# Patient Record
Sex: Male | Born: 1970 | Race: Black or African American | Hispanic: No | Smoking: Current every day smoker
Health system: Southern US, Community
[De-identification: ages and names within clinical notes are randomized; demographics above are authoritative.]

## PROBLEM LIST (undated history)

## (undated) DIAGNOSIS — J449 Chronic obstructive pulmonary disease, unspecified: Secondary | ICD-10-CM

## (undated) DIAGNOSIS — G4730 Sleep apnea, unspecified: Secondary | ICD-10-CM

## (undated) DIAGNOSIS — R011 Cardiac murmur, unspecified: Secondary | ICD-10-CM

## (undated) DIAGNOSIS — J45909 Unspecified asthma, uncomplicated: Secondary | ICD-10-CM

## (undated) DIAGNOSIS — I10 Essential (primary) hypertension: Secondary | ICD-10-CM

## (undated) HISTORY — DX: Unspecified asthma, uncomplicated: J45.909

## (undated) HISTORY — DX: Sleep apnea, unspecified: G47.30

## (undated) HISTORY — PX: CHEST TUBE INSERTION: SHX231

---

## 1998-09-28 ENCOUNTER — Emergency Department (HOSPITAL_COMMUNITY): Admission: EM | Admit: 1998-09-28 | Discharge: 1998-09-28 | Payer: Self-pay | Admitting: Internal Medicine

## 1998-10-02 ENCOUNTER — Emergency Department (HOSPITAL_COMMUNITY): Admission: EM | Admit: 1998-10-02 | Discharge: 1998-10-02 | Payer: Self-pay | Admitting: Emergency Medicine

## 2004-10-25 ENCOUNTER — Ambulatory Visit: Payer: Self-pay | Admitting: Internal Medicine

## 2011-06-05 ENCOUNTER — Emergency Department (HOSPITAL_COMMUNITY): Payer: No Typology Code available for payment source

## 2011-06-05 ENCOUNTER — Emergency Department (HOSPITAL_COMMUNITY)
Admission: EM | Admit: 2011-06-05 | Discharge: 2011-06-05 | Disposition: A | Discharge status: Home / Self Care | Payer: No Typology Code available for payment source | Attending: Emergency Medicine | Admitting: Emergency Medicine

## 2011-06-05 DIAGNOSIS — M545 Low back pain, unspecified: Secondary | ICD-10-CM | POA: Insufficient documentation

## 2011-06-05 DIAGNOSIS — M546 Pain in thoracic spine: Secondary | ICD-10-CM | POA: Insufficient documentation

## 2011-06-05 DIAGNOSIS — S139XXA Sprain of joints and ligaments of unspecified parts of neck, initial encounter: Secondary | ICD-10-CM | POA: Insufficient documentation

## 2017-07-14 ENCOUNTER — Emergency Department (HOSPITAL_COMMUNITY): Payer: Self-pay

## 2017-07-14 ENCOUNTER — Other Ambulatory Visit: Payer: Self-pay

## 2017-07-14 ENCOUNTER — Encounter (HOSPITAL_COMMUNITY): Payer: Self-pay | Admitting: Emergency Medicine

## 2017-07-14 DIAGNOSIS — F17210 Nicotine dependence, cigarettes, uncomplicated: Secondary | ICD-10-CM | POA: Insufficient documentation

## 2017-07-14 DIAGNOSIS — F1290 Cannabis use, unspecified, uncomplicated: Secondary | ICD-10-CM | POA: Insufficient documentation

## 2017-07-14 DIAGNOSIS — J189 Pneumonia, unspecified organism: Secondary | ICD-10-CM | POA: Insufficient documentation

## 2017-07-14 LAB — CBC
HCT: 45.5 % (ref 39.0–52.0)
HEMOGLOBIN: 16 g/dL (ref 13.0–17.0)
MCH: 30.2 pg (ref 26.0–34.0)
MCHC: 35.2 g/dL (ref 30.0–36.0)
MCV: 85.8 fL (ref 78.0–100.0)
Platelets: 331 10*3/uL (ref 150–400)
RBC: 5.3 MIL/uL (ref 4.22–5.81)
RDW: 13.5 % (ref 11.5–15.5)
WBC: 18.4 10*3/uL — ABNORMAL HIGH (ref 4.0–10.5)

## 2017-07-14 LAB — BASIC METABOLIC PANEL
ANION GAP: 10 (ref 5–15)
BUN: 7 mg/dL (ref 6–20)
CALCIUM: 9.3 mg/dL (ref 8.9–10.3)
CO2: 24 mmol/L (ref 22–32)
Chloride: 99 mmol/L — ABNORMAL LOW (ref 101–111)
Creatinine, Ser: 1.06 mg/dL (ref 0.61–1.24)
GFR calc Af Amer: >60 mL/min (ref >60)
GFR calc non Af Amer: >60 mL/min (ref >60)
GLUCOSE: 125 mg/dL — AB (ref 65–99)
Potassium: 3.8 mmol/L (ref 3.5–5.1)
Sodium: 133 mmol/L — ABNORMAL LOW (ref 135–145)

## 2017-07-14 LAB — I-STAT TROPONIN, ED: Troponin i, poc: 0 ng/mL (ref 0.00–0.08)

## 2017-07-14 NOTE — ED Triage Notes (Signed)
Pt states he started having right sided cp that goes into his back. This started today around 2pm. Pt states he has had the same symptoms before approx a year ago. He feels it was related to back injury. Pt states pain is worse with movement. Denies nausea. Pt does endorse SOB at times.

## 2017-07-15 ENCOUNTER — Emergency Department (HOSPITAL_COMMUNITY)
Admission: EM | Admit: 2017-07-15 | Discharge: 2017-07-15 | Disposition: A | Discharge status: Home / Self Care | Payer: Self-pay | Attending: Emergency Medicine | Admitting: Emergency Medicine

## 2017-07-15 DIAGNOSIS — J181 Lobar pneumonia, unspecified organism: Secondary | ICD-10-CM

## 2017-07-15 DIAGNOSIS — J189 Pneumonia, unspecified organism: Secondary | ICD-10-CM

## 2017-07-15 HISTORY — DX: Cardiac murmur, unspecified: R01.1

## 2017-07-15 MED ORDER — HYDROCODONE-ACETAMINOPHEN 5-325 MG PO TABS
2.0000 | ORAL_TABLET | Freq: Once | ORAL | Status: AC
  Administered 2017-07-15: 2 via ORAL
  Filled 2017-07-15: qty 2

## 2017-07-15 MED ORDER — DOXYCYCLINE HYCLATE 100 MG PO TABS
100.0000 mg | ORAL_TABLET | Freq: Once | ORAL | Status: AC
  Administered 2017-07-15: 100 mg via ORAL
  Filled 2017-07-15: qty 1

## 2017-07-15 MED ORDER — KETOROLAC TROMETHAMINE 30 MG/ML IJ SOLN
30.0000 mg | Freq: Once | INTRAMUSCULAR | Status: AC
  Administered 2017-07-15: 30 mg via INTRAMUSCULAR
  Filled 2017-07-15: qty 1

## 2017-07-15 MED ORDER — DOXYCYCLINE HYCLATE 100 MG PO CAPS: 100.0000 mg | ORAL_CAPSULE | Freq: Two times a day (BID) | ORAL | 0 refills | Status: DC

## 2017-07-15 NOTE — ED Provider Notes (Signed)
[facility] EMER[facility] DEPARTMENT Provider Note   CSN: [id number] Arrival date & time: 07/14/17  1742     History   Chief Complaint Chief Complaint  Patient presents with  . Chest Pain    HPI Alex Maldonado is a 46 y.o. male.  The history is provided by the patient.  Chest Pain   This is a new problem. The current episode started 6 to 12 hours ago. The problem occurs constantly. The problem has not changed since onset.Pain location: right chest. The pain is moderate. The quality of the pain is described as pleuritic. The pain radiates to the upper back. The symptoms are aggravated by deep breathing and certain positions. Associated symptoms include cough and shortness of breath. Pertinent negatives include no abdominal pain, no fever, no hemoptysis, no leg pain, no lower extremity edema and no vomiting. He has tried nothing for the symptoms.  Pertinent negatives for past medical history include no CAD, no DVT and no PE.  Patient with h/o heart murmur presents with right sided chest pain, worse with deep breathing Reports he woke up from a nap and began having this pain He reports cough and mild SOB He reports he has had cough/congestion for past week, but CP just started No travel No h/o CAD/PE He is a smoker He is very active, he moves furniture but denies falls/trauma He usually never has limitations in activity   Reports h/o heart murmur, was followed as a child through teenage years, told it was normal Reports "lung surgery" several yrs ago on left chest to "get pus out"    Past Medical History:  Diagnosis Date  . Heart murmur     There are no active problems to display for this patient.   History reviewed. No pertinent surgical history.     Home Medications    Prior to Admission medications   Not on File    Family History History reviewed. No pertinent family history.  Social History Social History   Tobacco Use  . Smoking  status: Current Every Day Smoker    Types: Cigarettes  . Smokeless tobacco: Never Used  Substance Use Topics  . Alcohol use: Yes    Comment: beer  . Drug use: Yes    Types: Marijuana     Allergies   Patient has no known allergies.   Review of Systems Review of Systems  Constitutional: Positive for chills. Negative for fever.  Respiratory: Positive for cough and shortness of breath. Negative for hemoptysis.   Cardiovascular: Positive for chest pain. Negative for leg swelling.  Gastrointestinal: Negative for abdominal pain and vomiting.  Neurological: Negative for syncope.  All other systems reviewed and are negative.    Physical Exam Updated Vital Signs BP (!) 161/104 (BP Location: Right Arm)   Pulse 91   Temp 98.8 F (37.1 C) (Oral)   Resp 18   Ht 1.854 m (6\' 1" )   Wt 81.6 kg (180 lb)   SpO2 100%   BMI 23.75 kg/m   Physical Exam  CONSTITUTIONAL: Well developed/well nourished HEAD: Normocephalic/atraumatic EYES: EOMI/PERRL ENMT: Mucous membranes moist NECK: supple no meningeal signs SPINE/BACK:entire spine nontender CV: S1/S2 noted, murmur noted, does not radiate to neck LUNGS: brief/crackles to Right upper chest, no apparent distress ABDOMEN: soft, nontender  GU:no cva tenderness NEURO: Pt is awake/alert/appropriate, moves all extremitiesx4.  No facial droop.   EXTREMITIES: pulses normal/equal, full ROM, no LE edema/tenderness SKIN: warm, color normal PSYCH: no abnormalities of mood noted,  alert and oriented to situation  ED Treatments / Results  Labs (all labs ordered are listed, but only abnormal results are displayed) Labs Reviewed  BASIC METABOLIC PANEL - Abnormal; Notable for the following components:      Result Value   Sodium 133 (*)    Chloride 99 (*)    Glucose, Bld 125 (*)    All other components within normal limits  CBC - Abnormal; Notable for the following components:   WBC 18.4 (*)    All other components within normal limits  I-STAT  TROPONIN, ED    EKG  EKG Interpretation  Date/Time:  Monday July 14 2017 17:48:12 EST Ventricular Rate:  100 PR Interval:  150 QRS Duration: 96 QT Interval:  332 QTC Calculation: 428 R Axis:   97 Text Interpretation:  Normal sinus rhythm Rightward axis Incomplete right bundle branch block Nonspecific ST abnormality Abnormal ECG No previous ECGs available Confirmed by Ripley Fraise [phone]) on 07/15/2017 3:06:52 AM       Radiology Dg Chest 2 View  Result Date: 07/14/2017 CLINICAL DATA:  46 y/o  M; chest pain and pressure. EXAM: CHEST  2 VIEW COMPARISON:  None. FINDINGS: Normal heart size. Enlarged pulmonary artery silhouette. Ill-defined opacity of periphery of right upper lobe. Lungs are hyperinflated with flattened diaphragms compatible with COPD. Bones are unremarkable. IMPRESSION: Ill-defined opacity a periphery of right upper lobe may represent pneumonia or atelectasis. Findings of COPD. Enlarged main pulmonary suggests pulmonary artery hypertension. Electronically Signed   By: Kristine Garbe M.D.   On: 07/14/2017 19:21    Procedures Procedures   Medications Ordered in ED Medications  ketorolac (TORADOL) 30 MG/ML injection 30 mg (30 mg Intramuscular Given 07/15/17 0345)  HYDROcodone-acetaminophen (NORCO/VICODIN) 5-325 MG per tablet 2 tablet (2 tablets Oral Given 07/15/17 0345)  doxycycline (VIBRA-TABS) tablet 100 mg (100 mg Oral Given 07/15/17 0345)     Initial Impression / Assessment and Plan / ED Course  I have reviewed the triage vital signs and the nursing notes.  Pertinent labs & imaging results that were available during my care of the patient were reviewed by me and considered in my medical decision making (see chart for details).     4:20 AM Pt well appearing Reports recent cough/congestion over past week, then began to have chills, pleuritic CP With elevated WBC, abnormal CXR, suspicion for pneumonia By vitals, currently PERC negative He is  in no distress He is walking around room Denies dyspnea on exertion I doubt PE at this time   Advised to quit smoking as he may be developing COPD Discussed strict ER return precautions  Final Clinical Impressions(s) / ED Diagnoses   Final diagnoses:  Community acquired pneumonia of right upper lobe of lung [facility])    ED Discharge Orders        Ordered    doxycycline (VIBRAMYCIN) 100 MG capsule  2 times daily,   Status:  Discontinued     07/15/17 0416    doxycycline (VIBRAMYCIN) 100 MG capsule  2 times daily     07/15/17 0418       Ripley Fraise, MD 07/15/17 0423

## 2017-07-15 NOTE — ED Notes (Signed)
E-signature not working, pt verbalized understanding of DC instructions and prescriptions

## 2017-08-14 ENCOUNTER — Emergency Department (HOSPITAL_COMMUNITY): Payer: Self-pay

## 2017-08-14 ENCOUNTER — Encounter (HOSPITAL_COMMUNITY): Payer: Self-pay

## 2017-08-14 ENCOUNTER — Inpatient Hospital Stay (HOSPITAL_COMMUNITY)
Admission: EM | Admit: 2017-08-14 | Discharge: 2017-08-19 | DRG: 871 | Disposition: A | Discharge status: Home / Self Care | Payer: Self-pay | Attending: Internal Medicine | Admitting: Internal Medicine

## 2017-08-14 ENCOUNTER — Other Ambulatory Visit: Payer: Self-pay

## 2017-08-14 DIAGNOSIS — Z8701 Personal history of pneumonia (recurrent): Secondary | ICD-10-CM

## 2017-08-14 DIAGNOSIS — J9600 Acute respiratory failure, unspecified whether with hypoxia or hypercapnia: Secondary | ICD-10-CM

## 2017-08-14 DIAGNOSIS — L0291 Cutaneous abscess, unspecified: Secondary | ICD-10-CM

## 2017-08-14 DIAGNOSIS — E440 Moderate protein-calorie malnutrition: Secondary | ICD-10-CM | POA: Diagnosis present

## 2017-08-14 DIAGNOSIS — A419 Sepsis, unspecified organism: Principal | ICD-10-CM | POA: Diagnosis present

## 2017-08-14 DIAGNOSIS — J850 Gangrene and necrosis of lung: Secondary | ICD-10-CM | POA: Diagnosis present

## 2017-08-14 DIAGNOSIS — F17290 Nicotine dependence, other tobacco product, uncomplicated: Secondary | ICD-10-CM | POA: Diagnosis present

## 2017-08-14 DIAGNOSIS — J181 Lobar pneumonia, unspecified organism: Secondary | ICD-10-CM

## 2017-08-14 DIAGNOSIS — F17210 Nicotine dependence, cigarettes, uncomplicated: Secondary | ICD-10-CM | POA: Diagnosis present

## 2017-08-14 DIAGNOSIS — R011 Cardiac murmur, unspecified: Secondary | ICD-10-CM | POA: Diagnosis present

## 2017-08-14 DIAGNOSIS — R042 Hemoptysis: Secondary | ICD-10-CM | POA: Diagnosis present

## 2017-08-14 DIAGNOSIS — J189 Pneumonia, unspecified organism: Secondary | ICD-10-CM

## 2017-08-14 DIAGNOSIS — I10 Essential (primary) hypertension: Secondary | ICD-10-CM | POA: Diagnosis present

## 2017-08-14 DIAGNOSIS — Z720 Tobacco use: Secondary | ICD-10-CM

## 2017-08-14 DIAGNOSIS — Z6820 Body mass index (BMI) 20.0-20.9, adult: Secondary | ICD-10-CM

## 2017-08-14 HISTORY — DX: Essential (primary) hypertension: I10

## 2017-08-14 LAB — I-STAT TROPONIN, ED: TROPONIN I, POC: 0 ng/mL (ref 0.00–0.08)

## 2017-08-14 LAB — BASIC METABOLIC PANEL
ANION GAP: 8 (ref 5–15)
BUN: 7 mg/dL (ref 6–20)
CHLORIDE: 98 mmol/L — AB (ref 101–111)
CO2: 29 mmol/L (ref 22–32)
Calcium: 9 mg/dL (ref 8.9–10.3)
Creatinine, Ser: 1.1 mg/dL (ref 0.61–1.24)
GFR calc non Af Amer: >60 mL/min (ref >60)
Glucose, Bld: 92 mg/dL (ref 65–99)
Potassium: 4 mmol/L (ref 3.5–5.1)
Sodium: 135 mmol/L (ref 135–145)

## 2017-08-14 LAB — CBC
HCT: 42.1 % (ref 39.0–52.0)
HEMOGLOBIN: 14.3 g/dL (ref 13.0–17.0)
MCH: 28.7 pg (ref 26.0–34.0)
MCHC: 34 g/dL (ref 30.0–36.0)
MCV: 84.4 fL (ref 78.0–100.0)
Platelets: 601 10*3/uL — ABNORMAL HIGH (ref 150–400)
RBC: 4.99 MIL/uL (ref 4.22–5.81)
RDW: 13.1 % (ref 11.5–15.5)
WBC: 19.4 10*3/uL — ABNORMAL HIGH (ref 4.0–10.5)

## 2017-08-14 MED ORDER — DEXTROSE 5 % IV SOLN
500.0000 mg | Freq: Once | INTRAVENOUS | Status: AC
  Administered 2017-08-15: 500 mg via INTRAVENOUS
  Filled 2017-08-14: qty 500

## 2017-08-14 MED ORDER — DEXTROSE 5 % IV SOLN
1.0000 g | Freq: Once | INTRAVENOUS | Status: AC
  Administered 2017-08-15: 1 g via INTRAVENOUS
  Filled 2017-08-14: qty 10

## 2017-08-14 NOTE — ED Triage Notes (Signed)
Pt arrives POV c.o sharp CP that started last night on the right side under his arm, pt states he was seen here last month for the same and was dx with pneumonia. Pt also states he has been coughing up bright red blood since last night. Pt a.o, nad in triage

## 2017-08-14 NOTE — ED Provider Notes (Signed)
[facility] EMERGENCY DEPARTMENT Provider Note   CSN: [id number] Arrival date & time: 08/14/17  1813     History   Chief Complaint Chief Complaint  Patient presents with  . Chest Pain  . Cough    HPI Alex Maldonado is a 46 y.o. male.  The history is provided by the patient.  He has history of a heart murmur and hypertension.  1 month ago, he was treated for community-acquired pneumonia with doxycycline.  He took all of the antibiotics and stopped smoking at that point.  Over the last several days, cough has gotten worse and he has been coughing up blood.  He does admit to some weight loss and some night sweats, although he relates the night sweats to getting overheated from the fireplace.  He states he had lost his appetite originally but has gotten his appetite back but he does not know if he is regaining some of the weight that he lost.  Also, of note, he has not returned to work since he was seen last month.  He denies chest pain and dyspnea.  He is not able to tell me what his heart murmur is from, only that he was born with it.  Past Medical History:  Diagnosis Date  . Heart murmur   . Hypertension     There are no active problems to display for this patient.   History reviewed. No pertinent surgical history.     Home Medications    Prior to Admission medications   Medication Sig Start Date End Date Taking? Authorizing Provider  doxycycline (VIBRAMYCIN) 100 MG capsule Take 1 capsule (100 mg total) 2 (two) times daily by mouth. One po bid x 7 days 07/15/17   Ripley Fraise, MD    Family History No family history on file.  Social History Social History   Tobacco Use  . Smoking status: Current Every Day Smoker    Types: Cigarettes  . Smokeless tobacco: Never Used  Substance Use Topics  . Alcohol use: Yes    Comment: beer  . Drug use: Yes    Types: Marijuana     Allergies   Patient has no known allergies.   Review of  Systems Review of Systems  All other systems reviewed and are negative.    Physical Exam Updated Vital Signs BP 121/78 (BP Location: Right Arm)   Pulse 89   Temp 98.2 F (36.8 C) (Oral)   Resp 16   Ht 6' (1.829 m)   Wt 69.6 kg (153 lb 6.4 oz)   SpO2 96%   BMI 20.80 kg/m   Physical Exam  Nursing note and vitals reviewed.  46 year old male, resting comfortably and in no acute distress. Vital signs are normal. Oxygen saturation is 96%, which is normal. Head is normocephalic and atraumatic. PERRLA, EOMI. Oropharynx is clear. Neck is nontender and supple without adenopathy or JVD. Back is nontender and there is no CVA tenderness. Lungs are clear without rales, wheezes, or rhonchi.  There are some bronchial breath sounds heard at the apices. Chest is nontender. Heart has regular rate and rhythm.  There is a prominent midsystolic click with murmur consistent with mitral valve prolapse. Abdomen is soft, flat, nontender without masses or hepatosplenomegaly and peristalsis is normoactive. Extremities have no cyanosis or edema, full range of motion is present. Skin is warm and dry without rash. Neurologic: Mental status is normal, cranial nerves are intact, there are no motor or sensory deficits.  ED Treatments / Results  Labs (all labs ordered are listed, but only abnormal results are displayed) Labs Reviewed  BASIC METABOLIC PANEL - Abnormal; Notable for the following components:      Result Value   Chloride 98 (*)    All other components within normal limits  CBC - Abnormal; Notable for the following components:   WBC 19.4 (*)    Platelets 601 (*)    All other components within normal limits  CULTURE, BLOOD (ROUTINE X 2)  CULTURE, BLOOD (ROUTINE X 2)  ACID FAST SMEAR (AFB)  ACID FAST CULTURE WITH REFLEXED SENSITIVITIES  CULTURE, EXPECTORATED SPUTUM-ASSESSMENT  GRAM STAIN  DIFFERENTIAL  HIV ANTIBODY (ROUTINE TESTING)  STREP PNEUMONIAE URINARY ANTIGEN  PROCALCITONIN  CBC   COMPREHENSIVE METABOLIC PANEL  LEGIONELLA PNEUMOPHILA SEROGP 1 UR AG  I-STAT TROPONIN, ED  I-STAT CG4 LACTIC ACID, ED    EKG  EKG Interpretation  Date/Time:  Thursday August 14 2017 18:21:24 EST Ventricular Rate:  89 PR Interval:  156 QRS Duration: 106 QT Interval:  358 QTC Calculation: 435 R Axis:   91 Text Interpretation:  Normal sinus rhythm Right atrial enlargement Pulmonary disease pattern Incomplete right bundle branch block Right ventricular hypertrophy Abnormal ECG When compared with ECG of 07/14/2017, No significant change was found Confirmed by Delora Fuel ([id number]) on 08/14/2017 11:55:27 PM       Radiology Dg Chest 2 View  Result Date: 08/14/2017 CLINICAL DATA:  Recurrent productive cough. Patient diagnosed with pneumonia approximately 1 month ago. Current smoker. EXAM: CHEST  2 VIEW COMPARISON:  07/14/2017. FINDINGS: Cardiac silhouette normal in size, unchanged. Markedly enlarged main pulmonary trunk and central left pulmonary artery as noted previously. Hilar and mediastinal contours otherwise unremarkable. Marked worsening of airspace consolidation in the right upper lobe since the prior examination. Lungs remain clear otherwise. pleuroparenchymal scarring at the left base accounts for the blunted costophrenic angle, unchanged. Visualized bony thorax intact. Degenerative changes involving the visualized upper lumbar spine. IMPRESSION: Persistent or recurrent right upper lobe pneumonia which has worsened significantly since the chest x-ray 1 month ago. Electronically Signed   By: Evangeline Dakin M.D.   On: 08/14/2017 19:58    Procedures Procedures (including critical care time)  Medications Ordered in ED Medications  cefTRIAXone (ROCEPHIN) 1 g in dextrose 5 % 50 mL IVPB (not administered)  azithromycin (ZITHROMAX) 500 mg in dextrose 5 % 250 mL IVPB (not administered)  enoxaparin (LOVENOX) injection 40 mg (not administered)  0.9 %  sodium chloride infusion (not  administered)  acetaminophen (TYLENOL) tablet 650 mg (not administered)    Or  acetaminophen (TYLENOL) suppository 650 mg (not administered)  ondansetron (ZOFRAN) tablet 4 mg (not administered)    Or  ondansetron (ZOFRAN) injection 4 mg (not administered)  azithromycin (ZITHROMAX) 500 mg in dextrose 5 % 250 mL IVPB (not administered)  cefTRIAXone (ROCEPHIN) 1 g in dextrose 5 % 50 mL IVPB (not administered)     Initial Impression / Assessment and Plan / ED Course  I have reviewed the triage vital signs and the nursing notes.  Pertinent labs & imaging results that were available during my care of the patient were reviewed by me and considered in my medical decision making (see chart for details).  Worsening right upper lobe infiltrate with hemoptysis.  This, combined with history of weight loss and night sweats is very concerning for tuberculosis.  Blood cultures and lactic acid level are being obtained to rule out sepsis.  Sputum will be obtained to send  for testing for acid-fast bacilli.  Case is discussed with Dr. Tamala Julian, of [facility], who agrees to admit the patient.  Final Clinical Impressions(s) / ED Diagnoses   Final diagnoses:  Pneumonia of right upper lobe due to infectious organism [facility])    ED Discharge Orders    None       Delora Fuel, MD 78/24/23 [phone]

## 2017-08-14 NOTE — ED Notes (Signed)
ED Provider at bedside. 

## 2017-08-15 ENCOUNTER — Inpatient Hospital Stay (HOSPITAL_COMMUNITY): Payer: Self-pay

## 2017-08-15 ENCOUNTER — Telehealth: Payer: Self-pay | Admitting: Acute Care

## 2017-08-15 ENCOUNTER — Other Ambulatory Visit: Payer: Self-pay

## 2017-08-15 DIAGNOSIS — Z8701 Personal history of pneumonia (recurrent): Secondary | ICD-10-CM

## 2017-08-15 DIAGNOSIS — R091 Pleurisy: Secondary | ICD-10-CM

## 2017-08-15 DIAGNOSIS — R042 Hemoptysis: Secondary | ICD-10-CM

## 2017-08-15 DIAGNOSIS — E440 Moderate protein-calorie malnutrition: Secondary | ICD-10-CM

## 2017-08-15 DIAGNOSIS — J181 Lobar pneumonia, unspecified organism: Secondary | ICD-10-CM

## 2017-08-15 DIAGNOSIS — R011 Cardiac murmur, unspecified: Secondary | ICD-10-CM | POA: Diagnosis present

## 2017-08-15 DIAGNOSIS — R918 Other nonspecific abnormal finding of lung field: Secondary | ICD-10-CM

## 2017-08-15 DIAGNOSIS — J189 Pneumonia, unspecified organism: Secondary | ICD-10-CM

## 2017-08-15 DIAGNOSIS — A419 Sepsis, unspecified organism: Secondary | ICD-10-CM | POA: Diagnosis present

## 2017-08-15 DIAGNOSIS — J850 Gangrene and necrosis of lung: Secondary | ICD-10-CM | POA: Diagnosis present

## 2017-08-15 DIAGNOSIS — Z8709 Personal history of other diseases of the respiratory system: Secondary | ICD-10-CM

## 2017-08-15 HISTORY — DX: Pneumonia, unspecified organism: J18.9

## 2017-08-15 LAB — DIFFERENTIAL
BASOS PCT: 0 %
Basophils Absolute: 0 10*3/uL (ref 0.0–0.1)
EOS PCT: 0 %
Eosinophils Absolute: 0 10*3/uL (ref 0.0–0.7)
LYMPHS ABS: 2.9 10*3/uL (ref 0.7–4.0)
Lymphocytes Relative: 14 %
MONOS PCT: 9 %
Monocytes Absolute: 1.7 10*3/uL — ABNORMAL HIGH (ref 0.1–1.0)
Neutro Abs: 15.8 10*3/uL — ABNORMAL HIGH (ref 1.7–7.7)
Neutrophils Relative %: 77 %

## 2017-08-15 LAB — COMPREHENSIVE METABOLIC PANEL
ALT: 24 U/L (ref 17–63)
AST: 18 U/L (ref 15–41)
Albumin: 2.6 g/dL — ABNORMAL LOW (ref 3.5–5.0)
Alkaline Phosphatase: 76 U/L (ref 38–126)
Anion gap: 8 (ref 5–15)
BILIRUBIN TOTAL: 0.4 mg/dL (ref 0.3–1.2)
BUN: 8 mg/dL (ref 6–20)
CALCIUM: 8.8 mg/dL — AB (ref 8.9–10.3)
CHLORIDE: 100 mmol/L — AB (ref 101–111)
CO2: 26 mmol/L (ref 22–32)
CREATININE: 0.98 mg/dL (ref 0.61–1.24)
Glucose, Bld: 143 mg/dL — ABNORMAL HIGH (ref 65–99)
Potassium: 3.7 mmol/L (ref 3.5–5.1)
Sodium: 134 mmol/L — ABNORMAL LOW (ref 135–145)
TOTAL PROTEIN: 6.6 g/dL (ref 6.5–8.1)

## 2017-08-15 LAB — CBC
HEMATOCRIT: 37.5 % — AB (ref 39.0–52.0)
Hemoglobin: 13.2 g/dL (ref 13.0–17.0)
MCH: 29.1 pg (ref 26.0–34.0)
MCHC: 35.2 g/dL (ref 30.0–36.0)
MCV: 82.6 fL (ref 78.0–100.0)
PLATELETS: 514 10*3/uL — AB (ref 150–400)
RBC: 4.54 MIL/uL (ref 4.22–5.81)
RDW: 12.8 % (ref 11.5–15.5)
WBC: 20.3 10*3/uL — ABNORMAL HIGH (ref 4.0–10.5)

## 2017-08-15 LAB — I-STAT CG4 LACTIC ACID, ED
LACTIC ACID, VENOUS: 0.65 mmol/L (ref 0.5–1.9)
LACTIC ACID, VENOUS: 1.36 mmol/L (ref 0.5–1.9)

## 2017-08-15 LAB — EXPECTORATED SPUTUM ASSESSMENT W GRAM STAIN, RFLX TO RESP C

## 2017-08-15 LAB — STREP PNEUMONIAE URINARY ANTIGEN: Strep Pneumo Urinary Antigen: NEGATIVE

## 2017-08-15 LAB — HIV ANTIBODY (ROUTINE TESTING W REFLEX): HIV SCREEN 4TH GENERATION: NONREACTIVE

## 2017-08-15 LAB — EXPECTORATED SPUTUM ASSESSMENT W REFEX TO RESP CULTURE

## 2017-08-15 LAB — PROCALCITONIN: Procalcitonin: <0.1 ng/mL

## 2017-08-15 MED ORDER — MORPHINE SULFATE (PF) 4 MG/ML IV SOLN
2.0000 mg | INTRAVENOUS | Status: AC
  Administered 2017-08-15: 2 mg via INTRAVENOUS
  Filled 2017-08-15: qty 1

## 2017-08-15 MED ORDER — ONDANSETRON HCL 4 MG PO TABS: 4.0000 mg | ORAL_TABLET | Freq: Four times a day (QID) | ORAL | Status: DC | PRN

## 2017-08-15 MED ORDER — VANCOMYCIN HCL IN DEXTROSE 1-5 GM/200ML-% IV SOLN
1000.0000 mg | Freq: Once | INTRAVENOUS | Status: AC
  Administered 2017-08-15: 1000 mg via INTRAVENOUS
  Filled 2017-08-15: qty 200

## 2017-08-15 MED ORDER — SODIUM CHLORIDE 0.9 % IV SOLN
Freq: Once | INTRAVENOUS | Status: AC
  Administered 2017-08-15: 02:00:00 via INTRAVENOUS

## 2017-08-15 MED ORDER — DEXTROSE 5 % IV SOLN: 500.0000 mg | Freq: Every day | INTRAVENOUS | Status: DC

## 2017-08-15 MED ORDER — PIPERACILLIN-TAZOBACTAM 3.375 G IVPB
3.3750 g | Freq: Three times a day (TID) | INTRAVENOUS | Status: DC
  Administered 2017-08-15 – 2017-08-18 (×10): 3.375 g via INTRAVENOUS
  Filled 2017-08-15 (×14): qty 50

## 2017-08-15 MED ORDER — ACETAMINOPHEN 650 MG RE SUPP: 650.0000 mg | Freq: Four times a day (QID) | RECTAL | Status: DC | PRN

## 2017-08-15 MED ORDER — ENSURE ENLIVE PO LIQD
237.0000 mL | Freq: Two times a day (BID) | ORAL | Status: DC
  Administered 2017-08-15 – 2017-08-19 (×5): 237 mL via ORAL

## 2017-08-15 MED ORDER — ONDANSETRON HCL 4 MG/2ML IJ SOLN: 4.0000 mg | Freq: Four times a day (QID) | INTRAMUSCULAR | Status: DC | PRN

## 2017-08-15 MED ORDER — DEXTROSE 5 % IV SOLN: 1.0000 g | Freq: Every day | INTRAVENOUS | Status: DC

## 2017-08-15 MED ORDER — OXYCODONE-ACETAMINOPHEN 5-325 MG PO TABS
1.0000 | ORAL_TABLET | Freq: Four times a day (QID) | ORAL | Status: DC | PRN
  Administered 2017-08-15 – 2017-08-16 (×6): 1 via ORAL
  Filled 2017-08-15 (×6): qty 1

## 2017-08-15 MED ORDER — PIPERACILLIN-TAZOBACTAM 3.375 G IVPB 30 MIN
3.3750 g | Freq: Once | INTRAVENOUS | Status: AC
  Administered 2017-08-15: 3.375 g via INTRAVENOUS
  Filled 2017-08-15: qty 50

## 2017-08-15 MED ORDER — OXYCODONE-ACETAMINOPHEN 5-325 MG PO TABS
1.0000 | ORAL_TABLET | ORAL | Status: AC
  Administered 2017-08-15: 1 via ORAL
  Filled 2017-08-15: qty 1

## 2017-08-15 MED ORDER — ACETAMINOPHEN 325 MG PO TABS
650.0000 mg | ORAL_TABLET | Freq: Four times a day (QID) | ORAL | Status: DC | PRN
  Administered 2017-08-15 (×2): 650 mg via ORAL
  Filled 2017-08-15 (×2): qty 2

## 2017-08-15 MED ORDER — ENOXAPARIN SODIUM 40 MG/0.4ML ~~LOC~~ SOLN
40.0000 mg | Freq: Every day | SUBCUTANEOUS | Status: DC
  Filled 2017-08-15 (×6): qty 0.4

## 2017-08-15 MED ORDER — VANCOMYCIN HCL IN DEXTROSE 1-5 GM/200ML-% IV SOLN
1000.0000 mg | Freq: Three times a day (TID) | INTRAVENOUS | Status: DC
  Administered 2017-08-15 – 2017-08-17 (×6): 1000 mg via INTRAVENOUS
  Filled 2017-08-15 (×7): qty 200

## 2017-08-15 NOTE — ED Notes (Signed)
Heart Healthy Diet was ordered for Lunch. 

## 2017-08-15 NOTE — Telephone Encounter (Signed)
lmtcb X1 for pt to see if we can move his appt to 1:30 at [facility] request.

## 2017-08-15 NOTE — ED Notes (Signed)
Report given [place]

## 2017-08-15 NOTE — Progress Notes (Signed)
  PROGRESS NOTE  Patient admitted earlier this morning. See H&P. Alex Maldonado is a 46 yo black male with past medical history of HTN, tobacco abuse, heart murmur, reported history of left sided empyema requiring drainage in 2000. He now presents with complaint of right sided chest pain and hemoptysis. He states that he had a cold in November, which then developed into a right-sided pneumonia. He was seen in the ED, given doxycycline and sent home. He reports that he did have improvement in night sweats, chest pain, and cough. Then night prior to admission, his symptoms got worse, started to have hemoptysis, severe sharp right-sided chest pain. He also admits to weight loss of approximately 15-20 pounds in the last month.  He states that he was born in [place] and has been raised here all of his life.  Denies any travel outside of the [place].  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  He has 2 children, one is 10 and in college and one is 49 and goes to school here.  His younger son had a cough for 1 day a few weeks ago but has been healthy otherwise.  He has no family history of cancer that he knows of.  He has 1 dog at home, no other pets.  He was in prison for about 3 years and was released in 2016.  He has never been homeless, never served in the [employer]. He is a current tobacco smoker, has smoked 3-4 cigarettes daily since age [age] and quit on November 19 when he was diagnosed with pneumonia. He used to smoke marijuana; denies other illicit drug use.   Workup in the emergency department revealed leukocytosis of 19.4, CT chest revealed masslike consolidation in the right upper lobe. Findings suspicious for necrotic pneumonia, neoplasm could present with a similar appearance but is felt less likely.  Sputum culture was obtained. He was started on Rocephin, Zithromax, vancomycin. Will switch to vanco/zosyn until culture results available. Consult pulmonology.    [name],  DO [facility] [email or web address] Password [facility] 08/15/2017, 9:17 AM

## 2017-08-15 NOTE — H&P (Signed)
History and Physical    Alex Maldonado FWY:[id number] DOB: 08-09-1971 DOA: 08/14/2017  Referring MD/NP/PA:Dr. Delora Fuel PCP: Patient, No Pcp Per  Patient coming from: Home  Chief Complaint: Chest pain  I have personally briefly reviewed patient's old medical records in [place]   HPI: Alex Maldonado is a 46 y.o. 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 Patient was just seen in the emergency department on 11/20 with complaints right-sided chest pain that was pleuritic in nature found to have signs of pneumonia at that time.  Sent home with doxycycline.  He reports taking all of his medications as prescribed and felt like he had gotten better over the last week.  However, patient notes that he had continued to have a productive cough.  Last night he began coughing up small amounts of blood.  Associated symptoms included weight loss of approximately 15-20 pounds in the last month, night sweats, decreased appetite.  He reports that most of these symptoms had somewhat improved, and he was starting to regain weight following completing the initial antibiotics. 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  He denies having any fever, chills, dysuria, recent travel, leg swelling, or calf pain.  Patient reports that he quit smoking tobacco, drinking alcohol, and using any illicit drugs after he was seen it was seen last month.  ED Course: Upon admission into the emergency room patient was seen to be afebrile, pulse 89-105, respirations 14-23, blood pressures 121/78-100 32/89, O2 saturations 90-100% on room air.  Labs revealed WBC 19.9,  platelet count 601.  Chest x-ray showed worsening right upper lobe pneumonia.  Blood cultures along with AFB ordered.  Lactic acid was currently pending and patient was started on antibiotics of ceftriaxone and azithromycin.    Review of Systems  Constitutional: Positive for malaise/fatigue and weight loss. Negative for chills and fever.  HENT: Negative for ear discharge and nosebleeds.   Eyes: Negative for photophobia and pain.  Respiratory: Positive for cough, hemoptysis and sputum production.   Cardiovascular: Positive for chest pain. Negative for leg swelling.  Gastrointestinal: Negative for abdominal pain, nausea and vomiting.  Genitourinary: Negative for dysuria and frequency.  Musculoskeletal: Positive for back pain. Negative for falls.  Skin: Negative for itching and rash.  Neurological: Negative for speech change and focal weakness.  Endo/Heme/Allergies: Negative for environmental allergies and polydipsia.  Psychiatric/Behavioral: Negative for memory loss. The patient is not nervous/anxious.     Past Medical History:  Diagnosis Date  . Heart murmur   . Hypertension     History reviewed. No pertinent surgical history.   reports that he has been smoking cigarettes.  he has never used smokeless tobacco. He reports that he drinks alcohol. He reports that he uses drugs. Drug: Marijuana.  No Known Allergies  No family history on file.  Prior to Admission medications   Medication Sig Start Date End Date Taking? Authorizing Provider  doxycycline (VIBRAMYCIN) 100 MG capsule Take 1 capsule (100 mg total) 2 (two) times daily by mouth. One po bid x 7 days 07/15/17   Ripley Fraise, MD    Physical Exam:  Constitutional:  Male who appears to be in no acute distress at this time Vitals:   08/14/17 2330 08/14/17 2345 08/14/17 2358 08/15/17 0005  BP:   127/85 123/88  Pulse: (!) 105 100 96 92  Resp: 19 19 14  (!) 23  Temp:      TempSrc:      SpO2: 90% 100% 96% 97%  Weight:        Height:       Eyes: PERRL, lids and conjunctivae normal ENMT: Mucous membranes are moist. Posterior pharynx clear of any exudate or lesions.Normal dentition.  Neck: normal, supple, no masses, no thyromegaly Respiratory: Regular respiratory effort with rales appreciated of the right upper lung field. Cardiovascular: Regular rate and rhythm, + systolic ejection murmur that appears to be a 2/6. no rubs / gallops. No extremity edema. 2+ pedal pulses. No carotid bruits.  Abdomen: no tenderness, no masses palpated. No hepatosplenomegaly. Bowel sounds positive.  Musculoskeletal: no clubbing / cyanosis. No joint deformity upper and lower extremities. Good ROM, no contractures. Normal muscle tone.  Skin: no rashes, lesions, ulcers. No induration Neurologic: CN 2-12 grossly intact. Sensation intact, DTR normal. Strength 5/5 in all 4.  Psychiatric: Normal judgment and insight. Alert and oriented x 3. Normal mood.     Labs on Admission: I have personally reviewed following labs and imaging studies  CBC: Recent Labs  Lab 08/14/17 1850  WBC 19.4*  HGB 14.3  HCT 42.1  MCV 84.4  PLT 720*   Basic Metabolic Panel: Recent Labs  Lab 08/14/17 1850  NA 135  K 4.0  CL 98*  CO2 29  GLUCOSE 92  BUN 7  CREATININE 1.10  CALCIUM 9.0   GFR: Estimated Creatinine Clearance: 83.5 mL/min (by C-G formula based on SCr of 1.1 mg/dL). Liver Function Tests: No results for input(s): AST, ALT, ALKPHOS, BILITOT, PROT, ALBUMIN in the last 168 hours. No results for input(s): LIPASE, AMYLASE in the last 168 hours. No results for input(s): AMMONIA in the last 168 hours. Coagulation Profile: No results for input(s): INR, PROTIME in the last 168 hours. Cardiac Enzymes: No results for input(s): CKTOTAL, CKMB, CKMBINDEX, TROPONINI in the last 168 hours. BNP (last 3 results) No results for input(s): PROBNP in the last 8760 hours. HbA1C: No results for input(s): HGBA1C in the last 72 hours. CBG: No results for  input(s): GLUCAP in the last 168 hours. Lipid Profile: No results for input(s): CHOL, HDL, LDLCALC, TRIG, CHOLHDL, LDLDIRECT in the last 72 hours. Thyroid Function Tests: No results for input(s): TSH, T4TOTAL, FREET4, T3FREE, THYROIDAB in the last 72 hours. Anemia Panel: No results for input(s): VITAMINB12, FOLATE, FERRITIN, TIBC, IRON, RETICCTPCT in the last 72 hours. Urine analysis: No results found for: COLORURINE, APPEARANCEUR, LABSPEC, PHURINE, GLUCOSEU, HGBUR, BILIRUBINUR, KETONESUR, PROTEINUR, UROBILINOGEN, NITRITE, LEUKOCYTESUR Sepsis Labs: No results found for this or any previous visit (from the past 240 hour(s)).   Radiological Exams on Admission: Dg Chest 2 View  Result Date: 08/14/2017 CLINICAL DATA:  Recurrent productive cough. Patient diagnosed with pneumonia approximately 1 month ago. Current smoker. EXAM: CHEST  2 VIEW COMPARISON:  07/14/2017. FINDINGS: Cardiac silhouette normal in size, unchanged. Markedly enlarged main pulmonary trunk and central left pulmonary artery as noted previously. Hilar and mediastinal contours otherwise unremarkable. Marked worsening of airspace consolidation in the right upper lobe since the prior examination. Lungs remain clear otherwise. pleuroparenchymal scarring at the left base accounts for the blunted costophrenic angle, unchanged. Visualized bony thorax intact. Degenerative changes involving the visualized upper lumbar spine.  IMPRESSION: Persistent or recurrent right upper lobe pneumonia which has worsened significantly since the chest x-ray 1 month ago. Electronically Signed   By: Evangeline Dakin M.D.   On: 08/14/2017 19:58    EKG: Independently reviewed.  Sinus rhythm with signs of right ventricular hypertrophy.  Similar to previous tracing  Assessment/Plan Sepsis  2/2 necrotizing pneumonia: Acute.  Patient presents with complaints of right sided chest pain.  Previously found to have right upper lobe pneumonia treated with doxycycline last  month.  On admission patient found to be tachycardic and tachypneic with upper lobe pneumonia seen on x-ray.  Lactic acid was seen to be reassuring at 1.36.  Patient was initially started on empiric antibiotics of ceftriaxone and azithromycin. - Admit to telemetry bed - Droplet precautions - Follow-up fluid, sputum, and AFB studies - Continue Rocephin and azithromycin - Added on vancomycin for more gram-positive coverage after CT scan - oxycodone prn pain - May warrant ID and pulmonary consultation in a.m. - Recheck CBC in a.m.  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  Question at heart murmur related to previous history of empyema. - Check echocardiogram  History of empyema: Patient reports in the early 2000 having a left sided empyema requiring what sounds like cardiothoracic surgery to evacuate.  DVT prophylaxis: lovenox Code Status: full  Family Communication: Discussed plan of care with the patient and family present at bedside Disposition Plan: TBD Consults called: none  Admission status: Inpatient  Norval Morton MD [facility] Pager [phone]   If 7PM-7AM, please contact night-coverage [email or web address] Password [email or web address]  08/15/2017, 12:09 AM

## 2017-08-15 NOTE — ED Notes (Signed)
Admitting MD at bedside.

## 2017-08-15 NOTE — Progress Notes (Signed)
Pharmacy Antibiotic Note  Alex Maldonado is a 46 y.o. male admitted on 08/14/2017 with necrotizing PNA.  Pharmacy has been consulted for Vancomycin dosing.  Vancomycin 1 g IV given in ED at [place]: Vancomycin 1 g IV q8h   Height: 6' (182.9 cm) Weight: 153 lb 6.4 oz (69.6 kg) IBW/kg (Calculated) : 77.6  Temp (24hrs), Avg:98.3 F (36.8 C), Min:98.2 F (36.8 C), Max:98.3 F (36.8 C)  Recent Labs  Lab 08/14/17 1850 08/15/17 0007 08/15/17 0233 08/15/17 0332  WBC 19.4*  --   --  20.3*  CREATININE 1.10  --   --  0.98  LATICACIDVEN  --  1.36 0.65  --     Estimated Creatinine Clearance: 93.7 mL/min (by C-G formula based on SCr of 0.98 mg/dL).    No Known Allergies   Caryl Pina 08/15/2017 5:11 AM

## 2017-08-15 NOTE — ED Notes (Signed)
Heart Healthy diet breakfast tray ordered @ 0727.  

## 2017-08-15 NOTE — ED Notes (Signed)
Patient transported to CT 

## 2017-08-15 NOTE — ED Notes (Signed)
Pt is alert and oriented.  Pt is here with PNA and receiving anbx.  Pt denies discomfort.  Congested.  Transported to xray.

## 2017-08-15 NOTE — Telephone Encounter (Signed)
SG please advise if ok to keep double-book.  This is per [place].  Next available appt with any provider in office is 09/08/17.

## 2017-08-15 NOTE — Consult Note (Signed)
Name: Alex Maldonado MRN: [id number] DOB: 10/18/1970    ADMISSION DATE:  08/14/2017 CONSULTATION DATE:  12/20  REFERRING MD :  CHOI  CHIEF COMPLAINT:  Necrotizing PNA   BRIEF PATIENT DESCRIPTION:  46 yom admitted 12/21 w/ RUL cavitary PNA  SIGNIFICANT EVENTS    STUDIES:  CT chest 12/21: 1. Masslike consolidation in the right upper lobe extending from the hilum to the pleural surface peripherally. Internal cystic foci suggesting necrosis. Findings suspicious for necrotic pneumonia, neoplasm could present with a similar appearance but is felt less Likely. 2. Probable prominent right hilar node, suboptimally assessed without IV contrast. 3. Significantly dilated main and left pulmonary arteries, raising concern for pulmonary arterial hypertension. Lack of IV contrast limits more detailed assessment. 4. Moderate endobronchial debris in the left mainstem bronchus tracking into the lower lobe of left lower lobe atelectasis. This may be mucus plugging, however aspiration is considered. 5. Marked dilatation of the main and left pulmonary artery 6. 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  HIV AB 12/21:>>> U strep 12/21: negative  U legionella 12/21>>> afb 12/21>>>  HISTORY OF PRESENT ILLNESS:    46 year old aam w/ + h/o smoking, prior PNA w/ empyema on left requiring VATS in 2000, heart murmur. 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 Was in [place] ~ 3 yrs prior. Was seen in ER back on 11/20 w/ cc: right sided CP w/ cough after about 2 weeks of cold symptoms. Was treated w/ 7d course of doxy. Never really returned to baseline. Still w/ cough, poor PO intake, wt loss. On 12/19 started to have worsening cough w/ progressive blood tinged sputum that progressed to gross bloody sputum and associated right sided pleuritic pain which is why he presented to the ER  on 21st. In ER he was found to have RUL airspace disease and CT chest showed marked RUL consolidation w/ areas concerning for cavitation. PCCM asked to see re: these CT findings.  PAST MEDICAL HISTORY :   has a past medical history of Heart murmur and Hypertension.  has no past surgical history on file. Prior to Admission medications   Medication Sig Start Date End Date Taking? Authorizing Provider  Multiple Vitamin (MULTIVITAMIN WITH MINERALS) TABS tablet Take 1 tablet by mouth daily.   Yes [provider]   No Known Allergies  FAMILY HISTORY:  family history is not on file. SOCIAL HISTORY:  reports that he has been smoking cigarettes.  he has never used smokeless tobacco. He reports that he drinks alcohol. He reports that he uses drugs. Drug: Marijuana.  REVIEW OF SYSTEMS:   Constitutional: Negative for fever, chills, weight loss, fatigue and diaphoresis.  HENT: Negative for hearing loss, ear pain, nosebleeds, congestion, sore throat, neck pain, tinnitus and ear discharge.  tooth pain chronic ~ 95yrs left lower jaw  Eyes: Negative for blurred vision, double vision, photophobia, pain, discharge and redness.  Respiratory: + cough, hemoptysis, sputum production, shortness of breath, wheezing and stridor.   Cardiovascular: right sided chest pain, palpitations, orthopnea, claudication, leg swelling and PND.  Gastrointestinal: Negative for heartburn, nausea, vomiting, abdominal pain, diarrhea, constipation, blood in stool and melena.  Genitourinary: Negative for dysuria, urgency, frequency, hematuria and flank pain.  Musculoskeletal: Negative for myalgias, back pain, joint pain and falls.  Skin: Negative for itching and rash.  Neurological: Negative for dizziness, tingling, tremors, sensory change, speech change, focal weakness,  seizures, loss of consciousness, weakness and headaches.  Endo/Heme/Allergies: Negative for environmental allergies and polydipsia. Does not bruise/bleed  easily.  SUBJECTIVE:  + cough w/ blood and chest pain on right  VITAL SIGNS: Temp:  [98.2 F (36.8 C)-98.3 F (36.8 C)] 98.2 F (36.8 C) (12/20 2148) Pulse Rate:  [73-105] 82 (12/21 1000) Resp:  [14-30] 25 (12/21 1000) BP: (103-143)/(68-89) 112/70 (12/21 1000) SpO2:  [90 %-100 %] 99 % (12/21 1000) Weight:  [153 lb (69.4 kg)-153 lb 6.4 oz (69.6 kg)] 153 lb (69.4 kg) (12/21 0817)  PHYSICAL EXAMINATION: General:  Well developed 46 year old aam. Currently no acute distress.  Neuro:  Awake and alert no focal def  HEENT:  [place] MMM no JVD  Cardiovascular:  RRR  Lungs:  Scattered rhonchi, decreased RUL Abdomen:  Soft not tender  Musculoskeletal:  Equal st and bulk Skin:  Warm and dry   Recent Labs  Lab 08/14/17 1850 08/15/17 0332  NA 135 134*  K 4.0 3.7  CL 98* 100*  CO2 29 26  BUN 7 8  CREATININE 1.10 0.98  GLUCOSE 92 143*   Recent Labs  Lab 08/14/17 1850 08/15/17 0332  HGB 14.3 13.2  HCT 42.1 37.5*  WBC 19.4* 20.3*  PLT 601* 514*   Dg Chest 2 View  Result Date: 08/14/2017 CLINICAL DATA:  Recurrent productive cough. Patient diagnosed with pneumonia approximately 1 month ago. Current smoker. EXAM: CHEST  2 VIEW COMPARISON:  07/14/2017. FINDINGS: Cardiac silhouette normal in size, unchanged. Markedly enlarged main pulmonary trunk and central left pulmonary artery as noted previously. Hilar and mediastinal contours otherwise unremarkable. Marked worsening of airspace consolidation in the right upper lobe since the prior examination. Lungs remain clear otherwise. pleuroparenchymal scarring at the left base accounts for the blunted costophrenic angle, unchanged. Visualized bony thorax intact. Degenerative changes involving the visualized upper lumbar spine. IMPRESSION: Persistent or recurrent right upper lobe pneumonia which has worsened significantly since the chest x-ray 1 month ago. Electronically Signed   By: Evangeline Dakin M.D.   On: 08/14/2017 19:58   Ct Chest Wo  Contrast  Result Date: 08/15/2017 CLINICAL DATA:  Pneumonia, unresolved or complicated. Right-sided pain. Hemoptysis. EXAM: CT CHEST WITHOUT CONTRAST TECHNIQUE: Multidetector CT imaging of the chest was performed following the standard protocol without IV contrast. COMPARISON:  Radiograph yesterday as well as 07/14/2017 FINDINGS: Cardiovascular: The heart is normal in size. Marked enlargement of the main pulmonary artery measuring 4.5 cm, dilated left pulmonary artery measuring 3.1 cm. Possible distal perforating of the pulmonary artery is. Right pulmonary artery is normal in caliber. Detailed vascular assessment limited given lack contrast. Thoracic aorta is normal in caliber. There is no pericardial effusion. Mediastinum/Nodes: Possible enlarged right hilar node measuring 13 mm, suboptimally assessed without IV contrast. Small mediastinal nodes not enlarged by size criteria. The esophagus is decompressed. Visualized thyroid gland is normal. Lungs/Pleura: Dense right upper lobe consolidation extending from the hilum to the pleural surface peripherally. Consolidation appears masslike in the periphery. There are internal air bronchograms and cystic spaces cyst spaces suggesting necrosis. Surrounding irregular and ground-glass opacities, with patchy ground-glass opacity in the anterior most right upper lobe. Right pleural thickening at the apex. No bony destructive change of the adjacent ribs. There is an accessory cardiac bronchus arising from bronchus intermedius with a short bronchial stump, only minimal associated lung parenchyma. Intraluminal debris within the left mainstem bronchus tracking into the left lower bronchus with irregular left low linear opacities likely postobstructive. Minimal mucous in the  right mainstem bronchus. No pulmonary edema. Upper Abdomen: No acute abnormality. No left adrenal nodule. Right adrenal gland not included in the field of view. Musculoskeletal: There are no acute or  suspicious osseous abnormalities. No bony destructive change of right upper ribs in the region of consolidation. IMPRESSION: 1. Masslike consolidation in the right upper lobe extending from the hilum to the pleural surface peripherally. Internal cystic foci suggesting necrosis. Findings suspicious for necrotic pneumonia, neoplasm could present with a similar appearance but is felt less likely. 2. Probable prominent right hilar node, suboptimally assessed without IV contrast. 3. Significantly dilated main and left pulmonary arteries, raising concern for pulmonary arterial hypertension. Lack of IV contrast limits more detailed assessment. 4. Moderate endobronchial debris in the left mainstem bronchus tracking into the lower lobe of left lower lobe atelectasis. This may be mucus plugging, however aspiration is considered. 5. Marked dilatation of the main and left pulmonary artery 6. 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 Electronically Signed   By: Jeb Levering M.D.   On: 08/15/2017 01:27    ASSESSMENT / PLAN:  46 year old male with history of PNA and empyema presenting with RUL PNA, ?mass, hemoptysis and chest pain.  On exam, diminished BS on the RUL.  I reviewed chest CT myself, evidence of necrotizing PNA noted.  Discussed with PCCM-NP.  PNA: recent abx exposure  - Vanc/zosyn  - F/U on culture  - Orthopanogram   ?Mass:  - F/u chest CT in 6 wks  - No bronch  Hemoptysis: due to cough  - F/U clinically  - No bronch unless recurs   Chest pain: due to pleurisy   - NSAID as needed  - Follow clinically  PCCM will follow.  Will see again on Monday.  Patient seen and examined, agree with above note.  I dictated the care and orders written for this patient under my direction.  Rush Farmer, MD [phone]   08/15/2017, 10:04 AM

## 2017-08-15 NOTE — Progress Notes (Signed)
Initial Nutrition Assessment  DOCUMENTATION CODES:   Non-severe (moderate) malnutrition in context of acute illness/injury  INTERVENTION:   Ensure Enlive po BID, each supplement provides 350 kcal and 20 grams of protein  Discussed importance of nutrition at home, Discussed alternatives to ensure if needed.    NUTRITION DIAGNOSIS:   Moderate Malnutrition related to acute illness as evidenced by mild fat depletion, mild muscle depletion(10% weight loss x 4 weeks).  GOAL:   Patient will meet greater than or equal to 90% of their needs  MONITOR:   PO intake, Supplement acceptance  REASON FOR ASSESSMENT:   Malnutrition Screening Tool    ASSESSMENT:   15 yom admitted 12/21 w/ RUL cavitary PNA   Per MD evidence of necrotizing PNA  Per pt he was in normal health until a month ago when he got sick. He lost his appetite and had diarrhea causing him to lose a lot of weight. He reports usual weight of 170 lb. 10% weight loss x 1 month.  He works for a [employer] and is very active. He states that he was smoking and drinking ETOH most days. His ETOH was 6-9 beers at night after work. On days he did not work he would start drinking the morning. He would not eat right those days. A month ago he was only eating one meal a day, usually dinner (balogna sandwich or fast food meal). He was also having a lot of diarrhea due to antibiotics. For the last 2 weeks his appetite was back to normal.  He likes ensure and drank them for about a week at home but stopped because of the expense and he has not been working due to illness.   Medications and labs reviewed     Diet Order:  Diet Heart Room service appropriate? Yes; Fluid consistency: Thin  EDUCATION NEEDS:   Education needs have been addressed  Skin:  Skin Assessment: Reviewed RN Assessment  Last BM:  unknown  Height:   Ht Readings from Last 1 Encounters:  08/15/17 6' (1.829 m)    Weight:   Wt Readings from Last 1  Encounters:  08/15/17 153 lb (69.4 kg)    Ideal Body Weight:  80.9 kg  BMI:  Body mass index is 20.75 kg/m.  Estimated Nutritional Needs:   Kcal:  2100-2300  Protein:  105-120 grams  Fluid:  >2.1 L/day    Maylon Peppers RD, LDN, CNSC [phone] Pager [phone] After Hours Pager

## 2017-08-15 NOTE — Telephone Encounter (Signed)
I would prefer not to be double booked at 3 with 2 hospital follow ups. I would prefer to start clinic early and see the patient at 1:30. While my mother is recovering I would prefer not to make my day any longer . Thanks

## 2017-08-16 LAB — CBC
HCT: 38 % — ABNORMAL LOW (ref 39.0–52.0)
HEMOGLOBIN: 13 g/dL (ref 13.0–17.0)
MCH: 28.4 pg (ref 26.0–34.0)
MCHC: 34.2 g/dL (ref 30.0–36.0)
MCV: 83 fL (ref 78.0–100.0)
Platelets: 533 10*3/uL — ABNORMAL HIGH (ref 150–400)
RBC: 4.58 MIL/uL (ref 4.22–5.81)
RDW: 12.8 % (ref 11.5–15.5)
WBC: 18.2 10*3/uL — AB (ref 4.0–10.5)

## 2017-08-16 LAB — BASIC METABOLIC PANEL
ANION GAP: 10 (ref 5–15)
BUN: 9 mg/dL (ref 6–20)
CHLORIDE: 99 mmol/L — AB (ref 101–111)
CO2: 28 mmol/L (ref 22–32)
Calcium: 8.7 mg/dL — ABNORMAL LOW (ref 8.9–10.3)
Creatinine, Ser: 1.09 mg/dL (ref 0.61–1.24)
Glucose, Bld: 84 mg/dL (ref 65–99)
POTASSIUM: 3.8 mmol/L (ref 3.5–5.1)
SODIUM: 137 mmol/L (ref 135–145)

## 2017-08-16 LAB — ACID FAST SMEAR (AFB, MYCOBACTERIA): Acid Fast Smear: NEGATIVE

## 2017-08-16 LAB — LEGIONELLA PNEUMOPHILA SEROGP 1 UR AG: L. PNEUMOPHILA SEROGP 1 UR AG: NEGATIVE

## 2017-08-16 LAB — ACID FAST SMEAR (AFB)

## 2017-08-16 MED ORDER — OXYCODONE-ACETAMINOPHEN 5-325 MG PO TABS
1.0000 | ORAL_TABLET | Freq: Once | ORAL | Status: AC
  Administered 2017-08-17: 1 via ORAL
  Filled 2017-08-16: qty 1

## 2017-08-16 MED ORDER — OXYCODONE-ACETAMINOPHEN 5-325 MG PO TABS
1.0000 | ORAL_TABLET | Freq: Four times a day (QID) | ORAL | Status: DC | PRN
  Administered 2017-08-17 – 2017-08-19 (×10): 2 via ORAL
  Filled 2017-08-16 (×10): qty 2

## 2017-08-16 NOTE — Progress Notes (Signed)
PROGRESS NOTE    Alex Maldonado  VZC:[id number] DOB: 1971/07/14 DOA: 08/14/2017 PCP: Patient, No Pcp Per     Brief Narrative:  Alex Maldonado is a 46 yo black male with past medical history of HTN, tobacco abuse, heart murmur, reported history of left sided empyema requiring drainage in 2000. He now presents with complaint of right sided chest pain and hemoptysis. He states that he had a cold in November, which then developed into a right-sided pneumonia. He was seen in the ED, given doxycycline and sent home. He reports that he did have improvement in night sweats, chest pain, and cough. Then night prior to admission, his symptoms got worse, started to have hemoptysis, severe sharp right-sided chest pain. He also admits to weight loss of approximately 15-20 pounds in the last month.  He states that he was born in [place] and has been raised here all of his life.  Denies any travel outside of the [place].  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  He has 2 children, one is 69 and in college and one is 1 and goes to school here.  His younger son had a cough for 1 day a few weeks ago but has been healthy otherwise.  He has no family history of cancer that he knows of.  He has 1 dog at home, no other pets.  He was in prison for about 3 years and was released in 2016.  He has never been homeless, never served in the [employer]. He is a current tobacco smoker, has smoked 3-4 cigarettes daily since age [age] and quit on November 19 when he was diagnosed with pneumonia. He used to smoke marijuana; denies other illicit drug use.   Workup in the emergency department revealed leukocytosis of 19.4, CT chest revealed masslike consolidation in the right upper lobe. Findings suspicious for necrotic pneumonia, neoplasm could present with a similar appearance but is felt less likely. Sputum culture was obtained. He was started on Rocephin, Zithromax, vancomycin. Will switch to vanco/zosyn until  culture results available. Pulmonology was consulted.   Assessment & Plan:   Principal Problem:   Necrotizing pneumonia ([place]) Active Problems:   Sepsis ([place])   Heart murmur   Malnutrition of moderate degree   Sepsis secondary to necrotizing pneumonia -Continue vanco/zosyn -Sputum culture pending -HIV NR, strep pneumo negative  -Orthopanogram negative  -Follow up chest CT in 6 weeks to rule out underlying mass  -Appreciate pulmonology, they will re-evaluate patient 12/24  Tobacco abuse -Cessation counseling    DVT prophylaxis: lovenox Code Status: Full Family Communication: no family at bedside Disposition Plan: pending improvement, discharge home when stable   Consultants:   Pulmonology  Procedures:   None   Antimicrobials:  Anti-infectives (From admission, onward)   Start     Dose/Rate Route Frequency Ordered Stop   08/15/17 2200  azithromycin (ZITHROMAX) 500 mg in dextrose 5 % 250 mL IVPB  Status:  Discontinued     500 mg 250 mL/hr over 60 Minutes Intravenous Daily at bedtime 08/15/17 0021 08/15/17 0925   08/15/17 2200  cefTRIAXone (ROCEPHIN) 1 g in dextrose 5 % 50 mL IVPB  Status:  Discontinued     1 g 100 mL/hr over 30 Minutes Intravenous Daily at bedtime 08/15/17 0021 08/15/17 0925   08/15/17 1600  piperacillin-tazobactam (ZOSYN) IVPB 3.375 g     3.375 g 12.5 mL/hr over 240 Minutes Intravenous Every 8 hours 08/15/17 0925  08/15/17 1000  vancomycin (VANCOCIN) IVPB 1000 mg/200 mL premix     1,000 mg 200 mL/hr over 60 Minutes Intravenous Every 8 hours 08/15/17 0514     08/15/17 1000  piperacillin-tazobactam (ZOSYN) IVPB 3.375 g     3.375 g 100 mL/hr over 30 Minutes Intravenous  Once 08/15/17 0935 08/15/17 1029   08/15/17 0230  vancomycin (VANCOCIN) IVPB 1000 mg/200 mL premix     1,000 mg 200 mL/hr over 60 Minutes Intravenous  Once 08/15/17 0203 08/15/17 0444   08/14/17 2345  cefTRIAXone (ROCEPHIN) 1 g in dextrose 5 % 50 mL IVPB     1 g 100 mL/hr over  30 Minutes Intravenous  Once 08/14/17 2344 08/15/17 0224   08/14/17 2345  azithromycin (ZITHROMAX) 500 mg in dextrose 5 % 250 mL IVPB     500 mg 250 mL/hr over 60 Minutes Intravenous  Once 08/14/17 2344 08/15/17 0319       Subjective: Doing well this morning, hemoptysis now cleared. Continues to have sharp right sided chest pain worse with deep breaths and cough.   Objective: Vitals:   08/15/17 1307 08/15/17 1354 08/15/17 2139 08/16/17 0517  BP: 121/76 127/80 116/73 113/67  Pulse: 96 92 86 78  Resp: 18 20 18 18   Temp:  98 F (36.7 C) 98.6 F (37 C) 98.4 F (36.9 C)  TempSrc:  Oral    SpO2: 97% 100% 100% 98%  Weight:      Height:        Intake/Output Summary (Last 24 hours) at 08/16/2017 0910 Last data filed at 08/16/2017 0405 Gross per 24 hour  Intake 977 ml  Output 100 ml  Net 877 ml   Filed Weights   08/14/17 1824 08/15/17 0817  Weight: 69.6 kg (153 lb 6.4 oz) 69.4 kg (153 lb)    Examination:  General exam: Appears calm and comfortable  Respiratory system: Right crackles. Respiratory effort normal. On room air  Cardiovascular system: S1 & S2 heard, RRR. No JVD, murmurs, rubs, gallops or clicks. No pedal edema. Gastrointestinal system: Abdomen is nondistended, soft and nontender. No organomegaly or masses felt. Normal bowel sounds heard. Central nervous system: Alert and oriented. No focal neurological deficits. Extremities: Symmetric 5 x 5 power. Skin: No rashes, lesions or ulcers Psychiatry: Judgement and insight appear normal. Mood & affect appropriate.   Data Reviewed: I have personally reviewed following labs and imaging studies  CBC: Recent Labs  Lab 08/14/17 1850 08/14/17 2347 08/15/17 0332 08/16/17 0719  WBC 19.4*  --  20.3* 18.2*  NEUTROABS  --  15.8*  --   --   HGB 14.3  --  13.2 13.0  HCT 42.1  --  37.5* 38.0*  MCV 84.4  --  82.6 83.0  PLT 601*  --  514* 956*   Basic Metabolic Panel: Recent Labs  Lab 08/14/17 1850 08/15/17 0332  NA 135  134*  K 4.0 3.7  CL 98* 100*  CO2 29 26  GLUCOSE 92 143*  BUN 7 8  CREATININE 1.10 0.98  CALCIUM 9.0 8.8*   GFR: Estimated Creatinine Clearance: 93.4 mL/min (by C-G formula based on SCr of 0.98 mg/dL). Liver Function Tests: Recent Labs  Lab 08/15/17 0332  AST 18  ALT 24  ALKPHOS 76  BILITOT 0.4  PROT 6.6  ALBUMIN 2.6*   No results for input(s): LIPASE, AMYLASE in the last 168 hours. No results for input(s): AMMONIA in the last 168 hours. Coagulation Profile: No results for input(s): INR, PROTIME in  the last 168 hours. Cardiac Enzymes: No results for input(s): CKTOTAL, CKMB, CKMBINDEX, TROPONINI in the last 168 hours. BNP (last 3 results) No results for input(s): PROBNP in the last 8760 hours. HbA1C: No results for input(s): HGBA1C in the last 72 hours. CBG: No results for input(s): GLUCAP in the last 168 hours. Lipid Profile: No results for input(s): CHOL, HDL, LDLCALC, TRIG, CHOLHDL, LDLDIRECT in the last 72 hours. Thyroid Function Tests: No results for input(s): TSH, T4TOTAL, FREET4, T3FREE, THYROIDAB in the last 72 hours. Anemia Panel: No results for input(s): VITAMINB12, FOLATE, FERRITIN, TIBC, IRON, RETICCTPCT in the last 72 hours. Sepsis Labs: Recent Labs  Lab 08/15/17 0007 08/15/17 0233 08/15/17 0332  PROCALCITON  --   --  <0.10  LATICACIDVEN 1.36 0.65  --     Recent Results (from the past 240 hour(s))  Culture, sputum-assessment     Status: None   Collection Time: 08/15/17 12:18 AM  Result Value Ref Range Status   Specimen Description EXPECTORATED SPUTUM  Final   Special Requests NONE  Final   Sputum evaluation THIS SPECIMEN IS ACCEPTABLE FOR SPUTUM CULTURE  Final   Report Status 08/15/2017 FINAL  Final  Culture, respiratory (NON-Expectorated)     Status: None (Preliminary result)   Collection Time: 08/15/17 12:18 AM  Result Value Ref Range Status   Specimen Description EXPECTORATED SPUTUM  Final   Special Requests NONE Reflexed from [id number]  Final     Gram Stain   Final    ABUNDANT WBC PRESENT, PREDOMINANTLY PMN MODERATE GRAM POSITIVE COCCI RARE GRAM NEGATIVE COCCI    Culture PENDING  Incomplete   Report Status PENDING  Incomplete       Radiology Studies: Dg Orthopantogram  Result Date: 08/15/2017 CLINICAL DATA:  Rule out abscess. EXAM: ORTHOPANTOGRAM/PANORAMIC COMPARISON:  None. FINDINGS: Multiple missing teeth. No periapical lucencies to suggest periodontal abscess. No convincing caries. IMPRESSION: No acute findings.  No evidence of periodontal abscess. Electronically Signed   By: Franki Cabot M.D.   On: 08/15/2017 11:17   Dg Chest 2 View  Result Date: 08/14/2017 CLINICAL DATA:  Recurrent productive cough. Patient diagnosed with pneumonia approximately 1 month ago. Current smoker. EXAM: CHEST  2 VIEW COMPARISON:  07/14/2017. FINDINGS: Cardiac silhouette normal in size, unchanged. Markedly enlarged main pulmonary trunk and central left pulmonary artery as noted previously. Hilar and mediastinal contours otherwise unremarkable. Marked worsening of airspace consolidation in the right upper lobe since the prior examination. Lungs remain clear otherwise. pleuroparenchymal scarring at the left base accounts for the blunted costophrenic angle, unchanged. Visualized bony thorax intact. Degenerative changes involving the visualized upper lumbar spine. IMPRESSION: Persistent or recurrent right upper lobe pneumonia which has worsened significantly since the chest x-ray 1 month ago. Electronically Signed   By: Evangeline Dakin M.D.   On: 08/14/2017 19:58   Ct Chest Wo Contrast  Result Date: 08/15/2017 CLINICAL DATA:  Pneumonia, unresolved or complicated. Right-sided pain. Hemoptysis. EXAM: CT CHEST WITHOUT CONTRAST TECHNIQUE: Multidetector CT imaging of the chest was performed following the standard protocol without IV contrast. COMPARISON:  Radiograph yesterday as well as 07/14/2017 FINDINGS: Cardiovascular: The heart is normal in size.  Marked enlargement of the main pulmonary artery measuring 4.5 cm, dilated left pulmonary artery measuring 3.1 cm. Possible distal perforating of the pulmonary artery is. Right pulmonary artery is normal in caliber. Detailed vascular assessment limited given lack contrast. Thoracic aorta is normal in caliber. There is no pericardial effusion. Mediastinum/Nodes: Possible enlarged right hilar node measuring 13 mm,  suboptimally assessed without IV contrast. Small mediastinal nodes not enlarged by size criteria. The esophagus is decompressed. Visualized thyroid gland is normal. Lungs/Pleura: Dense right upper lobe consolidation extending from the hilum to the pleural surface peripherally. Consolidation appears masslike in the periphery. There are internal air bronchograms and cystic spaces cyst spaces suggesting necrosis. Surrounding irregular and ground-glass opacities, with patchy ground-glass opacity in the anterior most right upper lobe. Right pleural thickening at the apex. No bony destructive change of the adjacent ribs. There is an accessory cardiac bronchus arising from bronchus intermedius with a short bronchial stump, only minimal associated lung parenchyma. Intraluminal debris within the left mainstem bronchus tracking into the left lower bronchus with irregular left low linear opacities likely postobstructive. Minimal mucous in the right mainstem bronchus. No pulmonary edema. Upper Abdomen: No acute abnormality. No left adrenal nodule. Right adrenal gland not included in the field of view. Musculoskeletal: There are no acute or suspicious osseous abnormalities. No bony destructive change of right upper ribs in the region of consolidation. IMPRESSION: 1. Masslike consolidation in the right upper lobe extending from the hilum to the pleural surface peripherally. Internal cystic foci suggesting necrosis. Findings suspicious for necrotic pneumonia, neoplasm could present with a similar appearance but is felt less  likely. 2. Probable prominent right hilar node, suboptimally assessed without IV contrast. 3. Significantly dilated main and left pulmonary arteries, raising concern for pulmonary arterial hypertension. Lack of IV contrast limits more detailed assessment. 4. Moderate endobronchial debris in the left mainstem bronchus tracking into the lower lobe of left lower lobe atelectasis. This may be mucus plugging, however aspiration is considered. 5. Marked dilatation of the main and left pulmonary artery 6. 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 Electronically Signed   By: Jeb Levering M.D.   On: 08/15/2017 01:27      Scheduled Meds: . enoxaparin (LOVENOX) injection  40 mg Subcutaneous Daily  . feeding supplement (ENSURE ENLIVE)  237 mL Oral BID BM   Continuous Infusions: . piperacillin-tazobactam (ZOSYN)  IV 3.375 g (08/16/17 0806)  . vancomycin Stopped (08/16/17 0328)     LOS: 1 day    Time spent: 30 minutes   [name], DO [facility] [email or web address] Password [email or web address] 08/16/2017, 9:10 AM

## 2017-08-17 DIAGNOSIS — J181 Lobar pneumonia, unspecified organism: Secondary | ICD-10-CM

## 2017-08-17 LAB — CULTURE, RESPIRATORY W GRAM STAIN

## 2017-08-17 LAB — BASIC METABOLIC PANEL
ANION GAP: 8 (ref 5–15)
BUN: 8 mg/dL (ref 6–20)
CALCIUM: 8.9 mg/dL (ref 8.9–10.3)
CHLORIDE: 98 mmol/L — AB (ref 101–111)
CO2: 28 mmol/L (ref 22–32)
Creatinine, Ser: 1.02 mg/dL (ref 0.61–1.24)
GFR calc non Af Amer: >60 mL/min (ref >60)
GLUCOSE: 120 mg/dL — AB (ref 65–99)
POTASSIUM: 3.6 mmol/L (ref 3.5–5.1)
Sodium: 134 mmol/L — ABNORMAL LOW (ref 135–145)

## 2017-08-17 LAB — CBC
HEMATOCRIT: 37.5 % — AB (ref 39.0–52.0)
HEMOGLOBIN: 13 g/dL (ref 13.0–17.0)
MCH: 28.9 pg (ref 26.0–34.0)
MCHC: 34.7 g/dL (ref 30.0–36.0)
MCV: 83.3 fL (ref 78.0–100.0)
Platelets: 513 10*3/uL — ABNORMAL HIGH (ref 150–400)
RBC: 4.5 MIL/uL (ref 4.22–5.81)
RDW: 13 % (ref 11.5–15.5)
WBC: 16.6 10*3/uL — AB (ref 4.0–10.5)

## 2017-08-17 LAB — SEDIMENTATION RATE: SED RATE: 83 mm/h — AB (ref 0–16)

## 2017-08-17 LAB — C-REACTIVE PROTEIN: CRP: 12.9 mg/dL — AB (ref <1.0)

## 2017-08-17 LAB — CULTURE, RESPIRATORY: CULTURE: NORMAL

## 2017-08-17 LAB — VANCOMYCIN, TROUGH: Vancomycin Tr: 29 ug/mL (ref 15–20)

## 2017-08-17 MED ORDER — VANCOMYCIN HCL IN DEXTROSE 1-5 GM/200ML-% IV SOLN
1000.0000 mg | Freq: Two times a day (BID) | INTRAVENOUS | Status: DC
  Filled 2017-08-17: qty 200

## 2017-08-17 MED ORDER — VANCOMYCIN HCL IN DEXTROSE 1-5 GM/200ML-% IV SOLN
1000.0000 mg | Freq: Three times a day (TID) | INTRAVENOUS | Status: DC
  Administered 2017-08-17 – 2017-08-18 (×4): 1000 mg via INTRAVENOUS
  Filled 2017-08-17 (×7): qty 200

## 2017-08-17 NOTE — Progress Notes (Signed)
[facility] PROGRESS NOTE  Alex Maldonado CXK:[id number] DOB: [birth year] DOA: 08/14/2017 PCP: Patient, No Pcp Per  Brief summary   46 yo black male with past medical history of HTN, tobacco abuse, heart murmur, reported history of left sided empyema requiring drainage in 2000. He now presents with complaint of right sided chest pain and hemoptysis. He states that he had a cold in November, which then developed into a right-sided pneumonia. He was seen in the ED, given doxycycline and sent home. He reports that he did have improvement in night sweats, chest pain, and cough. Then night prior to admission, his symptoms got worse, started to have hemoptysis, severe sharp right-sided chest pain. He also admits toweight loss of approximately 15-20 pounds in the last month.  ED. revealed leukocytosis of 19.4,CT chest revealed masslike consolidation in the right upper lobe.Findings suspicious for necrotic pneumonia, neoplasm could present with a similar appearance but is felt less likely. Sputum culture was obtained. He wasstarted on Rocephin, Zithromax, vancomycin. Will switch to vanco/zosyn until culture results available. Pulmonology was consulted.   Assessment/Plan: Principal Problem:   Necrotizing pneumonia ([place]) Active Problems:   Sepsis ([place])   Heart murmur   Malnutrition of moderate degree   Sepsis secondary to necrotizing pneumonia. HIV NR, strep pneumo negative. Orthopanogram negative. sputum culture pending. Will continue vanco/zosyn today. Possible transition to oral regimen in 24-48 hrs. Unlikely underlying vasculitis. Check ers,crp.  Needs repeat imaging in 4 weeks. [place] pulmonology, they will re-evaluate patient 12/24  Tobacco abuse. Cessation counseled   DVT prophylaxis: lovenox Code Status: Full Family Communication: no family at bedside Disposition Plan: pending improvement, discharge home when stable   Consultants:   Pulmonology  Procedures:    None    Antibiotics: Antibiotics Given (last 72 hours)    Date/Time Action Medication Dose Rate   08/15/17 0138 New Bag/Given   cefTRIAXone (ROCEPHIN) 1 g in dextrose 5 % 50 mL IVPB 1 g 100 mL/hr   08/15/17 0219 New Bag/Given   azithromycin (ZITHROMAX) 500 mg in dextrose 5 % 250 mL IVPB 500 mg 250 mL/hr   08/15/17 0344 New Bag/Given   vancomycin (VANCOCIN) IVPB 1000 mg/200 mL premix 1,000 mg 200 mL/hr   08/15/17 9702 New Bag/Given   vancomycin (VANCOCIN) IVPB 1000 mg/200 mL premix 1,000 mg 200 mL/hr   08/15/17 0958 New Bag/Given   piperacillin-tazobactam (ZOSYN) IVPB 3.375 g 3.375 g 100 mL/hr   08/15/17 1600 New Bag/Given   piperacillin-tazobactam (ZOSYN) IVPB 3.375 g 3.375 g 12.5 mL/hr   08/15/17 1822 New Bag/Given   vancomycin (VANCOCIN) IVPB 1000 mg/200 mL premix 1,000 mg 200 mL/hr   08/15/17 2344 New Bag/Given   piperacillin-tazobactam (ZOSYN) IVPB 3.375 g 3.375 g 12.5 mL/hr   08/16/17 0219 New Bag/Given   vancomycin (VANCOCIN) IVPB 1000 mg/200 mL premix 1,000 mg 200 mL/hr   08/16/17 6378 New Bag/Given   piperacillin-tazobactam (ZOSYN) IVPB 3.375 g 3.375 g 12.5 mL/hr   08/16/17 5885 New Bag/Given   vancomycin (VANCOCIN) IVPB 1000 mg/200 mL premix 1,000 mg 200 mL/hr   08/16/17 1634 New Bag/Given   piperacillin-tazobactam (ZOSYN) IVPB 3.375 g 3.375 g 12.5 mL/hr   08/16/17 1728 New Bag/Given   vancomycin (VANCOCIN) IVPB 1000 mg/200 mL premix 1,000 mg 200 mL/hr   08/16/17 2234 New Bag/Given   piperacillin-tazobactam (ZOSYN) IVPB 3.375 g 3.375 g 12.5 mL/hr   08/17/17 0405 New Bag/Given   vancomycin (VANCOCIN) IVPB 1000 mg/200 mL premix 1,000 mg 200 mL/hr   08/17/17 0827 New Bag/Given  piperacillin-tazobactam (ZOSYN) IVPB 3.375 g 3.375 g 12.5 mL/hr        (indicate start date, and stop date if known)  HPI/Subjective: Alert. Has productive cough. No chest pains. Afebrile   Objective: Vitals:   08/16/17 2240 08/17/17 0607  BP: 118/72 127/71  Pulse: 87 81  Resp: 20  16  Temp: 99.1 F (37.3 C) 98.2 F (36.8 C)  SpO2: 100% 100%    Intake/Output Summary (Last 24 hours) at 08/17/2017 1049 Last data filed at 08/17/2017 0932 Gross per 24 hour  Intake 937 ml  Output -  Net 937 ml   Filed Weights   08/14/17 1824 08/15/17 0817  Weight: 69.6 kg (153 lb 6.4 oz) 69.4 kg (153 lb)    Exam:   General:  Alert. No dsitress  Cardiovascular: s1,s2 rrr  Respiratory: few rales R lung   Abdomen: soft, nt, nd   Musculoskeletal: no leg edema    Data Reviewed: Basic Metabolic Panel: Recent Labs  Lab 08/14/17 1850 08/15/17 0332 08/16/17 0719 08/17/17 0438  NA 135 134* 137 134*  K 4.0 3.7 3.8 3.6  CL 98* 100* 99* 98*  CO2 29 26 28 28   GLUCOSE 92 143* 84 120*  BUN 7 8 9 8   CREATININE 1.10 0.98 1.09 1.02  CALCIUM 9.0 8.8* 8.7* 8.9   Liver Function Tests: Recent Labs  Lab 08/15/17 0332  AST 18  ALT 24  ALKPHOS 76  BILITOT 0.4  PROT 6.6  ALBUMIN 2.6*   No results for input(s): LIPASE, AMYLASE in the last 168 hours. No results for input(s): AMMONIA in the last 168 hours. CBC: Recent Labs  Lab 08/14/17 1850 08/14/17 2347 08/15/17 0332 08/16/17 0719 08/17/17 0438  WBC 19.4*  --  20.3* 18.2* 16.6*  NEUTROABS  --  15.8*  --   --   --   HGB 14.3  --  13.2 13.0 13.0  HCT 42.1  --  37.5* 38.0* 37.5*  MCV 84.4  --  82.6 83.0 83.3  PLT 601*  --  514* 533* 513*   Cardiac Enzymes: No results for input(s): CKTOTAL, CKMB, CKMBINDEX, TROPONINI in the last 168 hours. BNP (last 3 results) No results for input(s): BNP in the last 8760 hours.  ProBNP (last 3 results) No results for input(s): PROBNP in the last 8760 hours.  CBG: No results for input(s): GLUCAP in the last 168 hours.  Recent Results (from the past 240 hour(s))  Acid Fast Smear (AFB)     Status: None   Collection Time: 08/14/17 11:43 PM  Result Value Ref Range Status   AFB Specimen Processing Concentration  Final   Acid Fast Smear Negative  Final    Comment:  (NOTE) Performed At: [facility] [place], [place] [id number] Rush Farmer MD EX:[id number]    Source (AFB) SPUTUM  Final  Culture, blood (routine x 2)     Status: None (Preliminary result)   Collection Time: 08/14/17 11:55 PM  Result Value Ref Range Status   Specimen Description BLOOD LEFT ANTECUBITAL  Final   Special Requests   Final    BOTTLES DRAWN AEROBIC AND ANAEROBIC Blood Culture adequate volume   Culture NO GROWTH 1 DAY  Final   Report Status PENDING  Incomplete  Culture, blood (routine x 2)     Status: None (Preliminary result)   Collection Time: 08/15/17 12:08 AM  Result Value Ref Range Status   Specimen Description BLOOD RIGHT ANTECUBITAL  Final   Special Requests  Final    BOTTLES DRAWN AEROBIC AND ANAEROBIC Blood Culture results may not be optimal due to an excessive volume of blood received in culture bottles   Culture NO GROWTH 1 DAY  Final   Report Status PENDING  Incomplete  Culture, sputum-assessment     Status: None   Collection Time: 08/15/17 12:18 AM  Result Value Ref Range Status   Specimen Description EXPECTORATED SPUTUM  Final   Special Requests NONE  Final   Sputum evaluation THIS SPECIMEN IS ACCEPTABLE FOR SPUTUM CULTURE  Final   Report Status 08/15/2017 FINAL  Final  Culture, respiratory (NON-Expectorated)     Status: None (Preliminary result)   Collection Time: 08/15/17 12:18 AM  Result Value Ref Range Status   Specimen Description EXPECTORATED SPUTUM  Final   Special Requests NONE Reflexed from [id number]  Final   Gram Stain   Final    ABUNDANT WBC PRESENT, PREDOMINANTLY PMN MODERATE GRAM POSITIVE COCCI RARE GRAM NEGATIVE COCCI    Culture CULTURE REINCUBATED FOR BETTER GROWTH  Final   Report Status PENDING  Incomplete     Studies: Dg Orthopantogram  Result Date: 08/15/2017 CLINICAL DATA:  Rule out abscess. EXAM: ORTHOPANTOGRAM/PANORAMIC COMPARISON:  None. FINDINGS: Multiple missing teeth. No periapical lucencies to  suggest periodontal abscess. No convincing caries. IMPRESSION: No acute findings.  No evidence of periodontal abscess. Electronically Signed   By: Franki Cabot M.D.   On: 08/15/2017 11:17    Scheduled Meds: . enoxaparin (LOVENOX) injection  40 mg Subcutaneous Daily  . feeding supplement (ENSURE ENLIVE)  237 mL Oral BID BM   Continuous Infusions: . piperacillin-tazobactam (ZOSYN)  IV 3.375 g (08/17/17 0827)  . vancomycin Stopped (08/17/17 0932)    Principal Problem:   Necrotizing pneumonia ([place]) Active Problems:   Sepsis ([place])   Heart murmur   Malnutrition of moderate degree    Time spent: >35 minutes     Kinnie Feil  [facility] Pager [phone]. If 7PM-7AM, please contact night-coverage at [email or web address], password [facility] 08/17/2017, 10:49 AM  LOS: 2 days

## 2017-08-17 NOTE — Progress Notes (Signed)
Pharmacy Antibiotic Note  Alex Maldonado is a 46 y.o. male admitted on 08/14/2017 with necrotizing pneumonia seen on a CT scan.  Pharmacy has been consulted for vancomycin dosing. WBC elevated but trending down, afebrile, renal function stable, and PCT negative. Respiratory culture resulted with normal respiratory flora and blood cultures are NGTD.  A vancomycin trough was drawn this morning and was supratherapeutic. However, the previous dose was given late and the trough was drawn early. By our calculations the true trough would be ~20-21, but since it is so far outside the window, will need a repeat if vancomycin continued.  Plan: Continue vancomycin 1000mg  Q8hrs Check a trough if continuing vancomycin Monitor renal function, length of therapy  Height: 6' (182.9 cm) Weight: 153 lb (69.4 kg) IBW/kg (Calculated) : 77.6  Temp (24hrs), Avg:98.4 F (36.9 C), Min:97.8 F (36.6 C), Max:99.1 F (37.3 C)  Recent Labs  Lab 08/14/17 1850 08/15/17 0007 08/15/17 0233 08/15/17 0332 08/16/17 0719 08/17/17 0438 08/17/17 0742  WBC 19.4*  --   --  20.3* 18.2* 16.6*  --   CREATININE 1.10  --   --  0.98 1.09 1.02  --   LATICACIDVEN  --  1.36 0.65  --   --   --   --   VANCOTROUGH  --   --   --   --   --   --  29*    Estimated Creatinine Clearance: 89.8 mL/min (by C-G formula based on SCr of 1.02 mg/dL).    Antimicrobials this admission: Vanc 12/21> Zosyn 12/21>>  Ceftriaxone 12/21>>12/21 Azithromycin 12/21>> 12/21  Dose adjustments this admission: 12/23: Vanc trough 29 (only 4hrs post dose), continue current regimen  Microbiology results: 12/21 BCx: NGTD 12/21 Sputum: normal flora 12/20 AFB: negative   Thank you for allowing pharmacy to be a part of this patient's care.   Patterson Hammersmith PharmD PGY1 Pharmacy Practice Resident 08/17/2017 11:40 AM Pager: [phone] Phone: [phone]

## 2017-08-18 ENCOUNTER — Inpatient Hospital Stay (HOSPITAL_COMMUNITY): Payer: Self-pay

## 2017-08-18 LAB — BASIC METABOLIC PANEL
ANION GAP: 9 (ref 5–15)
BUN: 7 mg/dL (ref 6–20)
CALCIUM: 9.4 mg/dL (ref 8.9–10.3)
CO2: 27 mmol/L (ref 22–32)
CREATININE: 1.16 mg/dL (ref 0.61–1.24)
Chloride: 102 mmol/L (ref 101–111)
GFR calc Af Amer: >60 mL/min (ref >60)
GLUCOSE: 102 mg/dL — AB (ref 65–99)
Potassium: 3.9 mmol/L (ref 3.5–5.1)
Sodium: 138 mmol/L (ref 135–145)

## 2017-08-18 LAB — MAGNESIUM: Magnesium: 2 mg/dL (ref 1.7–2.4)

## 2017-08-18 LAB — CBC
HCT: 40.1 % (ref 39.0–52.0)
Hemoglobin: 13.8 g/dL (ref 13.0–17.0)
MCH: 28.8 pg (ref 26.0–34.0)
MCHC: 34.4 g/dL (ref 30.0–36.0)
MCV: 83.7 fL (ref 78.0–100.0)
PLATELETS: 547 10*3/uL — AB (ref 150–400)
RBC: 4.79 MIL/uL (ref 4.22–5.81)
RDW: 13.1 % (ref 11.5–15.5)
WBC: 16.6 10*3/uL — AB (ref 4.0–10.5)

## 2017-08-18 LAB — LACTIC ACID, PLASMA: Lactic Acid, Venous: 0.6 mmol/L (ref 0.5–1.9)

## 2017-08-18 LAB — VANCOMYCIN, TROUGH: VANCOMYCIN TR: 30 ug/mL — AB (ref 15–20)

## 2017-08-18 MED ORDER — SODIUM CHLORIDE 3 % IN NEBU
4.0000 mL | INHALATION_SOLUTION | Freq: Three times a day (TID) | RESPIRATORY_TRACT | Status: DC
  Administered 2017-08-18 – 2017-08-19 (×3): 4 mL via RESPIRATORY_TRACT
  Filled 2017-08-18 (×3): qty 4

## 2017-08-18 MED ORDER — DM-GUAIFENESIN ER 30-600 MG PO TB12
1.0000 | ORAL_TABLET | Freq: Two times a day (BID) | ORAL | Status: DC
  Administered 2017-08-18 – 2017-08-19 (×2): 1 via ORAL
  Filled 2017-08-18 (×2): qty 1

## 2017-08-18 MED ORDER — ALBUTEROL SULFATE (2.5 MG/3ML) 0.083% IN NEBU
2.5000 mg | INHALATION_SOLUTION | Freq: Three times a day (TID) | RESPIRATORY_TRACT | Status: DC
  Administered 2017-08-18 – 2017-08-19 (×3): 2.5 mg via RESPIRATORY_TRACT
  Filled 2017-08-18 (×2): qty 3

## 2017-08-18 MED ORDER — LEVOFLOXACIN 750 MG PO TABS
750.0000 mg | ORAL_TABLET | Freq: Every day | ORAL | Status: DC
  Administered 2017-08-19: 750 mg via ORAL
  Filled 2017-08-18: qty 1

## 2017-08-18 NOTE — Progress Notes (Signed)
PROGRESS NOTE    Alex Maldonado  TGG:[id number] DOB: 11/08/1970 DOA: 08/14/2017 PCP: Patient, No Pcp Per     Brief Narrative:  46 yo BM PMHx HTN, tobacco abuse, heart murmur, reported history of left sided empyema requiring drainage in 2000.   Now presents with complaint of right sided chest pain and hemoptysis. He states that he had a cold in November, which then developed into a right-sided pneumonia. He was seen in the ED, given doxycycline and sent home. He reports that he did have improvement in night sweats, chest pain, and cough. Then night prior to admission, his symptoms got worse, started to have hemoptysis, severe sharp right-sided chest pain. He also admits to weight loss of approximately 15-20 pounds in the last month.   ED. revealed leukocytosis of 19.4, CT chest revealed masslike consolidation in the right upper lobe. Findings suspicious for necrotic pneumonia, neoplasm could present with a similar appearance but is felt less likely. Sputum culture was obtained. He was started on Rocephin, Zithromax, vancomycin. Will switch to vanco/zosyn until culture results available. Pulmonology was consulted    Subjective: 12/24 A/O x4, positive CP right upper chest wall with deep inspiration.  Positive SOB, negative abdominal pain negative N/V   Assessment & Plan:   Principal Problem:   Necrotizing pneumonia ([place]) Active Problems:   Sepsis ([place])   Heart murmur   Malnutrition of moderate degree   Sepsis unspecified organism/ Necrotizing pneumonia. -Hypertonic saline's nebulizer + albuterol nebulizer  -Continue current antibiotics -On 12/25 will consult with pharmacy is there a broad-spectrum PO regimen safe to discharge patient on?   - Per PCCM will require 10 days of antibiotics. -Repeat chest CT in 6 weeks as outpatient -Follow-up with PCCM as outpatient   Tobacco abuse -Counseled patient on absolute necessity to discontinue.      DVT prophylaxis: Lovenox Code  Status: Full Family Communication: Family present at bedside for discussion of plan of care Disposition Plan: Discharge next 24-48 hours   Consultants:  PCCM    Procedures/Significant Events:     I have personally reviewed and interpreted all radiology studies and my findings are as above.  VENTILATOR SETTINGS:    Cultures   Antimicrobials: Anti-infectives (From admission, onward)   Start     Stop   08/17/17 1700  vancomycin (VANCOCIN) IVPB 1000 mg/200 mL premix  Status:  Discontinued     08/17/17 1124   08/17/17 1200  vancomycin (VANCOCIN) IVPB 1000 mg/200 mL premix         08/15/17 2200  azithromycin (ZITHROMAX) 500 mg in dextrose 5 % 250 mL IVPB  Status:  Discontinued     08/15/17 0925   08/15/17 2200  cefTRIAXone (ROCEPHIN) 1 g in dextrose 5 % 50 mL IVPB  Status:  Discontinued     08/15/17 0925   08/15/17 1600  piperacillin-tazobactam (ZOSYN) IVPB 3.375 g         08/15/17 1000  vancomycin (VANCOCIN) IVPB 1000 mg/200 mL premix  Status:  Discontinued     08/17/17 1051   08/15/17 1000  piperacillin-tazobactam (ZOSYN) IVPB 3.375 g     08/15/17 1029   08/15/17 0230  vancomycin (VANCOCIN) IVPB 1000 mg/200 mL premix     08/15/17 0444   08/14/17 2345  cefTRIAXone (ROCEPHIN) 1 g in dextrose 5 % 50 mL IVPB     08/15/17 0224   08/14/17 2345  azithromycin (ZITHROMAX) 500 mg in dextrose 5 % 250 mL IVPB     08/15/17 0319  Devices    LINES / TUBES:      Continuous Infusions: . piperacillin-tazobactam (ZOSYN)  IV 3.375 g (08/18/17 0819)  . vancomycin Stopped (08/18/17 0820)     Objective: Vitals:   08/17/17 0607 08/17/17 1452 08/17/17 2203 08/18/17 0636  BP: 127/71 120/88 111/64 114/69  Pulse: 81 85 72 76  Resp: 16 18 19 18   Temp: 98.2 F (36.8 C) 98 F (36.7 C) 98.5 F (36.9 C) 98 F (36.7 C)  TempSrc: Oral  Oral Oral  SpO2: 100% 99% 99% 99%  Weight:      Height:        Intake/Output Summary (Last 24 hours) at 08/18/2017 0844 Last data filed  at 08/17/2017 1500 Gross per 24 hour  Intake 545 ml  Output -  Net 545 ml   Filed Weights   08/14/17 1824 08/15/17 0817  Weight: 153 lb 6.4 oz (69.6 kg) 153 lb (69.4 kg)    Examination:  General: A/O x4, positive acute respiratory distress Neck:  Negative scars, masses, torticollis, lymphadenopathy, JVD Lungs: decreased to absent breath sounds RUL, remainder of lungs clear to auscultation, negative wheeze, negative crackles  Cardiovascular: Regular rate and rhythm without murmur gallop or rub normal S1 and S2 Abdomen: negative abdominal pain, nondistended, positive soft, bowel sounds, no rebound, no ascites, no appreciable mass Extremities: No significant cyanosis, clubbing, or edema bilateral lower extremities Skin: Negative rashes, lesions, ulcers Psychiatric:  Negative depression, negative anxiety, negative fatigue, negative mania  Central nervous system:  Cranial nerves II through XII intact, tongue/uvula midline, all extremities muscle strength 5/5, sensation intact throughout,  negative dysarthria, negative expressive aphasia, negative receptive aphasia.  .     Data Reviewed: Care during the described time interval was provided by me .  I have reviewed this patient's available data, including medical history, events of note, physical examination, and all test results as part of my evaluation.  CBC: Recent Labs  Lab 08/14/17 1850 08/14/17 2347 08/15/17 0332 08/16/17 0719 08/17/17 0438 08/18/17 0350  WBC 19.4*  --  20.3* 18.2* 16.6* 16.6*  NEUTROABS  --  15.8*  --   --   --   --   HGB 14.3  --  13.2 13.0 13.0 13.8  HCT 42.1  --  37.5* 38.0* 37.5* 40.1  MCV 84.4  --  82.6 83.0 83.3 83.7  PLT 601*  --  514* 533* 513* 563*   Basic Metabolic Panel: Recent Labs  Lab 08/14/17 1850 08/15/17 0332 08/16/17 0719 08/17/17 0438  NA 135 134* 137 134*  K 4.0 3.7 3.8 3.6  CL 98* 100* 99* 98*  CO2 29 26 28 28   GLUCOSE 92 143* 84 120*  BUN 7 8 9 8   CREATININE 1.10 0.98  1.09 1.02  CALCIUM 9.0 8.8* 8.7* 8.9   GFR: Estimated Creatinine Clearance: 89.8 mL/min (by C-G formula based on SCr of 1.02 mg/dL). Liver Function Tests: Recent Labs  Lab 08/15/17 0332  AST 18  ALT 24  ALKPHOS 76  BILITOT 0.4  PROT 6.6  ALBUMIN 2.6*   No results for input(s): LIPASE, AMYLASE in the last 168 hours. No results for input(s): AMMONIA in the last 168 hours. Coagulation Profile: No results for input(s): INR, PROTIME in the last 168 hours. Cardiac Enzymes: No results for input(s): CKTOTAL, CKMB, CKMBINDEX, TROPONINI in the last 168 hours. BNP (last 3 results) No results for input(s): PROBNP in the last 8760 hours. HbA1C: No results for input(s): HGBA1C in the last 72 hours. CBG:  No results for input(s): GLUCAP in the last 168 hours. Lipid Profile: No results for input(s): CHOL, HDL, LDLCALC, TRIG, CHOLHDL, LDLDIRECT in the last 72 hours. Thyroid Function Tests: No results for input(s): TSH, T4TOTAL, FREET4, T3FREE, THYROIDAB in the last 72 hours. Anemia Panel: No results for input(s): VITAMINB12, FOLATE, FERRITIN, TIBC, IRON, RETICCTPCT in the last 72 hours. Sepsis Labs: Recent Labs  Lab 08/15/17 0007 08/15/17 0233 08/15/17 0332  PROCALCITON  --   --  <0.10  LATICACIDVEN 1.36 0.65  --     Recent Results (from the past 240 hour(s))  Acid Fast Smear (AFB)     Status: None   Collection Time: 08/14/17 11:43 PM  Result Value Ref Range Status   AFB Specimen Processing Concentration  Final   Acid Fast Smear Negative  Final    Comment: (NOTE) Performed At: [facility] [address] [place], [place] [id number] Rush Farmer MD [id number]    Source (AFB) SPUTUM  Final  Culture, blood (routine x 2)     Status: None (Preliminary result)   Collection Time: 08/14/17 11:55 PM  Result Value Ref Range Status   Specimen Description BLOOD LEFT ANTECUBITAL  Final   Special Requests   Final    BOTTLES DRAWN AEROBIC AND ANAEROBIC Blood Culture adequate  volume   Culture NO GROWTH 2 DAYS  Final   Report Status PENDING  Incomplete  Culture, blood (routine x 2)     Status: None (Preliminary result)   Collection Time: 08/15/17 12:08 AM  Result Value Ref Range Status   Specimen Description BLOOD RIGHT ANTECUBITAL  Final   Special Requests   Final    BOTTLES DRAWN AEROBIC AND ANAEROBIC Blood Culture results may not be optimal due to an excessive volume of blood received in culture bottles   Culture NO GROWTH 2 DAYS  Final   Report Status PENDING  Incomplete  Culture, sputum-assessment     Status: None   Collection Time: 08/15/17 12:18 AM  Result Value Ref Range Status   Specimen Description EXPECTORATED SPUTUM  Final   Special Requests NONE  Final   Sputum evaluation THIS SPECIMEN IS ACCEPTABLE FOR SPUTUM CULTURE  Final   Report Status 08/15/2017 FINAL  Final  Culture, respiratory (NON-Expectorated)     Status: None   Collection Time: 08/15/17 12:18 AM  Result Value Ref Range Status   Specimen Description EXPECTORATED SPUTUM  Final   Special Requests NONE Reflexed from [id number]  Final   Gram Stain   Final    ABUNDANT WBC PRESENT, PREDOMINANTLY PMN MODERATE GRAM POSITIVE COCCI RARE GRAM NEGATIVE COCCI    Culture Consistent with normal respiratory flora.  Final   Report Status 08/17/2017 FINAL  Final         Radiology Studies: No results found.      Scheduled Meds: . enoxaparin (LOVENOX) injection  40 mg Subcutaneous Daily  . feeding supplement (ENSURE ENLIVE)  237 mL Oral BID BM   Continuous Infusions: . piperacillin-tazobactam (ZOSYN)  IV 3.375 g (08/18/17 0819)  . vancomycin Stopped (08/18/17 0820)     LOS: 3 days    Time spent:40 min    WOODS, Geraldo Docker, MD [facility] Pager [phone]  If 7PM-7AM, please contact night-coverage [email or web address] Password [facility] 08/18/2017, 8:44 AM

## 2017-08-18 NOTE — Progress Notes (Signed)
Pt eating at this time. CPT/Vest therapy/HHN will be held at this time and will be started at 2000.

## 2017-08-18 NOTE — Progress Notes (Signed)
   Name: Alex Maldonado MRN: [id number] DOB: [birth year]    ADMISSION DATE:  08/14/2017 CONSULTATION DATE:  12/20  REFERRING MD :  CHOI  CHIEF COMPLAINT:  Necrotizing PNA   BRIEF PATIENT DESCRIPTION:  48 yom admitted 12/21 w/ RUL cavitary PNA   SIGNIFICANT EVENTS   12/20 > admit  STUDIES:  CT chest 12/21: 1. Masslike consolidation in the right upper lobe extending from the hilum to the pleural surface peripherally. Internal cystic foci suggesting necrosis. Findings suspicious for necrotic pneumonia, neoplasm could present with a similar appearance but is felt less Likely. 2. Probable prominent right hilar node, suboptimally assessed without IV contrast. 3. Significantly dilated main and left pulmonary arteries, raising concern for pulmonary arterial hypertension. Lack of IV contrast limits more detailed assessment. 4. Moderate endobronchial debris in the left mainstem bronchus tracking into the lower lobe of left lower lobe atelectasis. This may be mucus plugging, however aspiration is considered. 5. Marked dilatation of the main and left pulmonary artery 6. 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  HIV AB 12/21:>>> neg U strep 12/21: negative  U legionella 12/21>>> neg afb 12/21>>> neg  SUBJECTIVE:  Cough has improved since admission, minimal now.  Has mild pleuritic chest pain still but much improved. Eager to go home.  VITAL SIGNS: Temp:  [98 F (36.7 C)-98.5 F (36.9 C)] 98 F (36.7 C) (12/24 0636) Pulse Rate:  [72-85] 76 (12/24 0636) Resp:  [18-19] 18 (12/24 0636) BP: (111-120)/(64-88) 114/69 (12/24 0636) SpO2:  [99 %] 99 % (12/24 0636)  PHYSICAL EXAMINATION: General:  Well developed 46 year old man, in NAD Neuro:  Awake and alert, no focal deficits HEENT:  [place] MMM no JVD  Cardiovascular:  RRR  Lungs:  Faint rhonchi RML Abdomen:  BS x 4,  S/NT/ND Musculoskeletal:  Equal st and bulk Skin:  Warm and dry   Recent Labs  Lab 08/16/17 0719 08/17/17 0438 08/18/17 0907  NA 137 134* 138  K 3.8 3.6 3.9  CL 99* 98* 102  CO2 28 28 27   BUN 9 8 7   CREATININE 1.09 1.02 1.16  GLUCOSE 84 120* 102*   Recent Labs  Lab 08/16/17 0719 08/17/17 0438 08/18/17 0350  HGB 13.0 13.0 13.8  HCT 38.0* 37.5* 40.1  WBC 18.2* 16.6* 16.6*  PLT 533* 513* 547*   No results found.  ASSESSMENT / PLAN:  46 year old male with history of PNA and empyema presenting with RML / RUL PNA (? Necrotizing), ?mass, hemoptysis and chest pain.  PNA: recent abx exposure (doxy).  Orthopantogram negative.  - Continue empiric vanc/zosyn  - F/U on cultures  ?Mass:  - F/u chest CT in 6 wks  - No bronch needed  Hemoptysis: due to PNA, improving 12/24 and now with minimal cough  - F/U clinically  - No bronch needed  Chest pain: due to pleurisy from PNA  - NSAID as needed  - Follow clinically  Nothing further to add.  Follow cultures and narrow abx to oral, would give 10 day course. Needs repeat chest CT in 6 weeks as outpatient. PCCM will sign off.  Please do not hesitate to call us back if we can be of any further assistance.   Montey Hora, [facility] Pager: [phone]  or [phone] 08/18/2017, 12:01 PM

## 2017-08-18 NOTE — Telephone Encounter (Signed)
Please book the pt to see me in clinic for follow up on Jan 10th or 11th with CXR PA [facility] on day clinic. I think I have some slots opened up.  Marshell Garfinkel MD  Pulmonary and Critical Care 08/18/2017, 2:43 PM

## 2017-08-18 NOTE — Telephone Encounter (Signed)
Called and spoke to pt's parents who stated that their son is currently still in the hospital.  I had told them that we were needing to change the time of pt's appt but with pt still in the hospital, they wanted to wait until he was out of the hospital to reschedule pt's appt.  Will wait until pt is out of the hospital to change appt that has been scheduled with SG.

## 2017-08-18 NOTE — Progress Notes (Signed)
  RT at bedside with pt, Pt is up walking around and into the bathroom no distress noted. Pt says he feels good and no secretions are noted. He says that he had some mucinex from the nurse and that dried up all the secretions. RT is going to page the doctor about contraindications of necrotizing pneumonia, RT feels unsafe about treatment ordered by doctor and just need clarification of order.

## 2017-08-19 DIAGNOSIS — Z720 Tobacco use: Secondary | ICD-10-CM

## 2017-08-19 DIAGNOSIS — A419 Sepsis, unspecified organism: Principal | ICD-10-CM

## 2017-08-19 LAB — BASIC METABOLIC PANEL
Anion gap: 9 (ref 5–15)
BUN: 9 mg/dL (ref 6–20)
CHLORIDE: 99 mmol/L — AB (ref 101–111)
CO2: 29 mmol/L (ref 22–32)
Calcium: 9.4 mg/dL (ref 8.9–10.3)
Creatinine, Ser: 1.42 mg/dL — ABNORMAL HIGH (ref 0.61–1.24)
GFR calc Af Amer: >60 mL/min (ref >60)
GFR, EST NON AFRICAN AMERICAN: 58 mL/min — AB (ref >60)
GLUCOSE: 161 mg/dL — AB (ref 65–99)
POTASSIUM: 3.8 mmol/L (ref 3.5–5.1)
Sodium: 137 mmol/L (ref 135–145)

## 2017-08-19 LAB — CBC
HEMATOCRIT: 39.2 % (ref 39.0–52.0)
Hemoglobin: 13.4 g/dL (ref 13.0–17.0)
MCH: 28.3 pg (ref 26.0–34.0)
MCHC: 34.2 g/dL (ref 30.0–36.0)
MCV: 82.9 fL (ref 78.0–100.0)
Platelets: 542 10*3/uL — ABNORMAL HIGH (ref 150–400)
RBC: 4.73 MIL/uL (ref 4.22–5.81)
RDW: 12.8 % (ref 11.5–15.5)
WBC: 17.5 10*3/uL — ABNORMAL HIGH (ref 4.0–10.5)

## 2017-08-19 LAB — MAGNESIUM: Magnesium: 2 mg/dL (ref 1.7–2.4)

## 2017-08-19 MED ORDER — OXYCODONE-ACETAMINOPHEN 5-325 MG PO TABS: 1.0000 | ORAL_TABLET | Freq: Four times a day (QID) | ORAL | 0 refills | Status: DC | PRN

## 2017-08-19 MED ORDER — LEVOFLOXACIN 750 MG PO TABS: 750.0000 mg | ORAL_TABLET | Freq: Every day | ORAL | 0 refills | Status: DC

## 2017-08-19 MED ORDER — DM-GUAIFENESIN ER 30-600 MG PO TB12: 1.0000 | ORAL_TABLET | Freq: Two times a day (BID) | ORAL | 0 refills | Status: DC

## 2017-08-19 MED ORDER — ALBUTEROL SULFATE HFA 108 (90 BASE) MCG/ACT IN AERS: 2.0000 | INHALATION_SPRAY | Freq: Four times a day (QID) | RESPIRATORY_TRACT | 0 refills | Status: DC | PRN

## 2017-08-19 NOTE — Discharge Summary (Signed)
Physician Discharge Summary  Alex Maldonado OXB:[id number] DOB: 07-10-1971 DOA: 08/14/2017  PCP: Patient, No Pcp Per  Admit date: 08/14/2017 Discharge date: 08/19/2017  Time spent: 35 minutes  Recommendations for Outpatient Follow-up:   Sepsis unspecified organism/ Necrotizing pneumonia. -complete additional 10-day course antibiotics per [facility] M -Follow-up with Dr.Mannam Praveen on January 10 or 11th -[facility] M scheduled PA/LAT CXR on 3 January after clinic at 1530        -patient will repeat chest CT in 6 weeks as outpatient. And follow-up again with [facility] M  Tobacco abuse -counseled patient at length concerning absolute necessity of stopping all tobacco products to include smoking, vapping.       Discharge Diagnoses:  Principal Problem:   Necrotizing pneumonia ([place]) Active Problems:   Sepsis ([place])   Heart murmur   Malnutrition of moderate degree   Discharge Condition: stable  Diet recommendation: regular  Filed Weights   08/14/17 1824 08/15/17 0817  Weight: 153 lb 6.4 oz (69.6 kg) 153 lb (69.4 kg)    History of present illness:  46 yo BM PMHx HTN, tobacco abuse, heart murmur, reported history of left sided empyema requiring drainage in 2000.    Now presents with complaint of right sided chest pain and hemoptysis. He states that he had a cold in November, which then developed into a right-sided pneumonia. He was seen in the ED, given doxycycline and sent home. He reports that he did have improvement in night sweats, chest pain, and cough. Then night prior to admission, his symptoms got worse, started to have hemoptysis, severe sharp right-sided chest pain. He also admits to weight loss of approximately 15-20 pounds in the last month.   ED. revealed leukocytosis of 19.4, CT chest revealed masslike consolidation in the right upper lobe. Findings suspicious for necrotic pneumonia, neoplasm could present with a similar appearance but is felt less likely. Sputum culture was  obtained. He was started on Rocephin, Zithromax, vancomycin. Will switch to vanco/zosyn until culture results available. Pulmonology was consulted  During this hospitalization treated for epsis unspecified organism secondary to necrotizing pneumonia right upper lobe. Patient treated initially with IV broad-spectrum antibiotics, has now been transitioned to PO broad-spectrum antibiotics to complete a 14 day course antibiotics. Patient will follow-up with [facility] M to ensure resolution.    Procedures: 12/21 CT chest WO contrast:Masslike consolidation in the right upper lobe extending from the hilum to the pleural surface peripherally. Internal cystic foci suggesting necrosis. Findings suspicious for necrotic pneumonia, neoplasm could present with a similar appearance but is felt less likely. 2. Probable prominent right hilar node, suboptimally assessed without IV contrast. 3. Significantly dilated main and left pulmonary arteries, raising concern for pulmonary arterial hypertension. Lack of IV contrast limits more detailed assessment. 4. Moderate endobronchial debris in the left mainstem bronchus tracking into the lower lobe of left lower lobe atelectasis. This may be mucus plugging, however aspiration is considered. 5. Marked dilatation of the main and left pulmonary artery 6. 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 12/21 DG Orthopantogram:negative. Periodontal abscess 12/24 DG CXR:-no change RUL pneumonia since previous exam 4 days ago     Consultations: [facility] M  Cultures  12/20 sputum AFB: Negative 2/20 blood NGTD 12/21 sputum normal flora     Antibiotics Anti-infectives (From admission, onward)   Start     Stop   08/19/17 1000  levofloxacin (LEVAQUIN) tablet 750 mg  08/29/17 0959   08/17/17 1700  vancomycin (VANCOCIN) IVPB 1000 mg/200 mL premix   Status:  Discontinued     08/17/17 1124   08/17/17 1200  vancomycin (VANCOCIN) IVPB 1000 mg/200 mL premix  Status:  Discontinued     08/18/17 1958   08/15/17 2200  azithromycin (ZITHROMAX) 500 mg in dextrose 5 % 250 mL IVPB  Status:  Discontinued     08/15/17 0925   08/15/17 2200  cefTRIAXone (ROCEPHIN) 1 g in dextrose 5 % 50 mL IVPB  Status:  Discontinued     08/15/17 0925   08/15/17 1600  piperacillin-tazobactam (ZOSYN) IVPB 3.375 g  Status:  Discontinued     08/18/17 2045   08/15/17 1000  vancomycin (VANCOCIN) IVPB 1000 mg/200 mL premix  Status:  Discontinued     08/17/17 1051   08/15/17 1000  piperacillin-tazobactam (ZOSYN) IVPB 3.375 g     08/15/17 1029   08/15/17 0230  vancomycin (VANCOCIN) IVPB 1000 mg/200 mL premix     08/15/17 0444   08/14/17 2345  cefTRIAXone (ROCEPHIN) 1 g in dextrose 5 % 50 mL IVPB     08/15/17 0224   08/14/17 2345  azithromycin (ZITHROMAX) 500 mg in dextrose 5 % 250 mL IVPB     08/15/17 0319       Discharge Exam: Vitals:   08/18/17 1538 08/18/17 2254 08/19/17 0458 08/19/17 0931  BP: 113/76 126/84 109/77   Pulse: 78 78 70   Resp: 20 16 20    Temp: (!) 97.5 F (36.4 C) 98.1 F (36.7 C) 98.5 F (36.9 C)   TempSrc:  Oral Oral   SpO2: 99% 100% 99% 99%  Weight:      Height:        General: A/O 4, negative acute respiratory distress Cardiovascular: regular rhythm and rate, negative murmurs rubs or gallops, normal S1/S2 Respiratory: clear to auscultation bilateral, negative wheezes, negative crackles. Positive pain on deep inspiration right upper chest wall  Discharge Instructions   Allergies as of 08/19/2017   No Known Allergies     Medication List    TAKE these medications   albuterol 108 (90 Base) MCG/ACT inhaler Commonly known as:  PROVENTIL HFA;VENTOLIN HFA Inhale 2 puffs into the lungs every 6 (six) hours as needed for wheezing or shortness of breath.   dextromethorphan-guaiFENesin 30-600 MG 12hr tablet Commonly known as:  MUCINEX  DM Take 1 tablet by mouth 2 (two) times daily.   levofloxacin 750 MG tablet Commonly known as:  LEVAQUIN Take 1 tablet (750 mg total) by mouth daily. Start taking on:  08/20/2017   multivitamin with minerals Tabs tablet Take 1 tablet by mouth daily.   oxyCODONE-acetaminophen 5-325 MG tablet Commonly known as:  PERCOCET/ROXICET Take 1-2 tablets by mouth every 6 (six) hours as needed for severe pain.      No Known Allergies Follow-up Information    Magdalen Spatz, NP Follow up on 08/27/2017.   Specialty:  Pulmonary Disease Why:  at 3 for check in and xray then 330 w/ sarah Contact information: [address] [place] [address] [phone]            The results of significant diagnostics from this hospitalization (including imaging, microbiology, ancillary and laboratory) are listed below for reference.    Significant Diagnostic Studies: Dg Orthopantogram  Result Date: 08/15/2017 CLINICAL DATA:  Rule out abscess. EXAM: ORTHOPANTOGRAM/PANORAMIC COMPARISON:  None. FINDINGS: Multiple missing teeth. No periapical lucencies to suggest periodontal abscess. No convincing caries.  IMPRESSION: No acute findings.  No evidence of periodontal abscess. Electronically Signed   By: Franki Cabot M.D.   On: 08/15/2017 11:17   Dg Chest 2 View  Result Date: 08/18/2017 CLINICAL DATA:  Follow-up necrotizing right upper lobe pneumonia. Patient admitted 08/13/2017 for a mild ptosis, fever and right-sided chest pain. EXAM: CHEST  2 VIEW COMPARISON:  CT chest 08/15/2017. Chest x-rays 08/14/2017, 07/14/2017. FINDINGS: Cardiac silhouette normal in size, unchanged. Thoracic aorta normal in appearance. Marked enlargement of the pulmonary trunk and the left main pulmonary artery as noted on the CT. Dense airspace consolidation with air bronchograms in the right upper lobe, unchanged since the examination 4 days ago. No new pulmonary parenchymal abnormalities. Stable blunting of the left  costophrenic angle due to pleuroparenchymal scarring. Visualized bony thorax intact. IMPRESSION: No change in the right upper lobe pneumonia since the examination 4 days ago. No new abnormalities. Electronically Signed   By: Evangeline Dakin M.D.   On: 08/18/2017 16:33   Dg Chest 2 View  Result Date: 08/14/2017 CLINICAL DATA:  Recurrent productive cough. Patient diagnosed with pneumonia approximately 1 month ago. Current smoker. EXAM: CHEST  2 VIEW COMPARISON:  07/14/2017. FINDINGS: Cardiac silhouette normal in size, unchanged. Markedly enlarged main pulmonary trunk and central left pulmonary artery as noted previously. Hilar and mediastinal contours otherwise unremarkable. Marked worsening of airspace consolidation in the right upper lobe since the prior examination. Lungs remain clear otherwise. pleuroparenchymal scarring at the left base accounts for the blunted costophrenic angle, unchanged. Visualized bony thorax intact. Degenerative changes involving the visualized upper lumbar spine. IMPRESSION: Persistent or recurrent right upper lobe pneumonia which has worsened significantly since the chest x-ray 1 month ago. Electronically Signed   By: Evangeline Dakin M.D.   On: 08/14/2017 19:58   Ct Chest Wo Contrast  Result Date: 08/15/2017 CLINICAL DATA:  Pneumonia, unresolved or complicated. Right-sided pain. Hemoptysis. EXAM: CT CHEST WITHOUT CONTRAST TECHNIQUE: Multidetector CT imaging of the chest was performed following the standard protocol without IV contrast. COMPARISON:  Radiograph yesterday as well as 07/14/2017 FINDINGS: Cardiovascular: The heart is normal in size. Marked enlargement of the main pulmonary artery measuring 4.5 cm, dilated left pulmonary artery measuring 3.1 cm. Possible distal perforating of the pulmonary artery is. Right pulmonary artery is normal in caliber. Detailed vascular assessment limited given lack contrast. Thoracic aorta is normal in caliber. There is no pericardial  effusion. Mediastinum/Nodes: Possible enlarged right hilar node measuring 13 mm, suboptimally assessed without IV contrast. Small mediastinal nodes not enlarged by size criteria. The esophagus is decompressed. Visualized thyroid gland is normal. Lungs/Pleura: Dense right upper lobe consolidation extending from the hilum to the pleural surface peripherally. Consolidation appears masslike in the periphery. There are internal air bronchograms and cystic spaces cyst spaces suggesting necrosis. Surrounding irregular and ground-glass opacities, with patchy ground-glass opacity in the anterior most right upper lobe. Right pleural thickening at the apex. No bony destructive change of the adjacent ribs. There is an accessory cardiac bronchus arising from bronchus intermedius with a short bronchial stump, only minimal associated lung parenchyma. Intraluminal debris within the left mainstem bronchus tracking into the left lower bronchus with irregular left low linear opacities likely postobstructive. Minimal mucous in the right mainstem bronchus. No pulmonary edema. Upper Abdomen: No acute abnormality. No left adrenal nodule. Right adrenal gland not included in the field of view. Musculoskeletal: There are no acute or suspicious osseous abnormalities. No bony destructive change of right upper ribs in the region of consolidation. IMPRESSION:  1. Masslike consolidation in the right upper lobe extending from the hilum to the pleural surface peripherally. Internal cystic foci suggesting necrosis. Findings suspicious for necrotic pneumonia, neoplasm could present with a similar appearance but is felt less likely. 2. Probable prominent right hilar node, suboptimally assessed without IV contrast. 3. Significantly dilated main and left pulmonary arteries, raising concern for pulmonary arterial hypertension. Lack of IV contrast limits more detailed assessment. 4. Moderate endobronchial debris in the left mainstem bronchus tracking into  the lower lobe of left lower lobe atelectasis. This may be mucus plugging, however aspiration is considered. 5. Marked dilatation of the main and left pulmonary artery 6. 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 Electronically Signed   By: Jeb Levering M.D.   On: 08/15/2017 01:27    Microbiology: Recent Results (from the past 240 hour(s))  Acid Fast Smear (AFB)     Status: None   Collection Time: 08/14/17 11:43 PM  Result Value Ref Range Status   AFB Specimen Processing Concentration  Final   Acid Fast Smear Negative  Final    Comment: (NOTE) Performed At: [facility] [address] [place], [place] [id number] Rush Farmer MD [id number]    Source (AFB) SPUTUM  Final  Culture, blood (routine x 2)     Status: None (Preliminary result)   Collection Time: 08/14/17 11:55 PM  Result Value Ref Range Status   Specimen Description BLOOD LEFT ANTECUBITAL  Final   Special Requests   Final    BOTTLES DRAWN AEROBIC AND ANAEROBIC Blood Culture adequate volume   Culture NO GROWTH 4 DAYS  Final   Report Status PENDING  Incomplete  Culture, blood (routine x 2)     Status: None (Preliminary result)   Collection Time: 08/15/17 12:08 AM  Result Value Ref Range Status   Specimen Description BLOOD RIGHT ANTECUBITAL  Final   Special Requests   Final    BOTTLES DRAWN AEROBIC AND ANAEROBIC Blood Culture results may not be optimal due to an excessive volume of blood received in culture bottles   Culture NO GROWTH 4 DAYS  Final   Report Status PENDING  Incomplete  Culture, sputum-assessment     Status: None   Collection Time: 08/15/17 12:18 AM  Result Value Ref Range Status   Specimen Description EXPECTORATED SPUTUM  Final   Special Requests NONE  Final   Sputum evaluation THIS SPECIMEN IS ACCEPTABLE FOR SPUTUM CULTURE  Final   Report Status 08/15/2017 FINAL   Final  Culture, respiratory (NON-Expectorated)     Status: None   Collection Time: 08/15/17 12:18 AM  Result Value Ref Range Status   Specimen Description EXPECTORATED SPUTUM  Final   Special Requests NONE Reflexed from [id number]  Final   Gram Stain   Final    ABUNDANT WBC PRESENT, PREDOMINANTLY PMN MODERATE GRAM POSITIVE COCCI RARE GRAM NEGATIVE COCCI    Culture Consistent with normal respiratory flora.  Final   Report Status 08/17/2017 FINAL  Final     Labs: Basic Metabolic Panel: Recent Labs  Lab 08/15/17 0332 08/16/17 0719 08/17/17 0438 08/18/17 0907 08/19/17 0859  NA 134* 137 134* 138 137  K 3.7 3.8 3.6 3.9 3.8  CL 100* 99* 98* 102 99*  CO2 26 28 28 27 29   GLUCOSE 143* 84 120* 102* 161*  BUN 8 9 8 7 9   CREATININE 0.98 1.09 1.02 1.16 1.42*  CALCIUM 8.8* 8.7* 8.9 9.4  9.4  MG  --   --   --  2.0 2.0   Liver Function Tests: Recent Labs  Lab 08/15/17 0332  AST 18  ALT 24  ALKPHOS 76  BILITOT 0.4  PROT 6.6  ALBUMIN 2.6*   No results for input(s): LIPASE, AMYLASE in the last 168 hours. No results for input(s): AMMONIA in the last 168 hours. CBC: Recent Labs  Lab 08/14/17 2347 08/15/17 0332 08/16/17 0719 08/17/17 0438 08/18/17 0350 08/19/17 0859  WBC  --  20.3* 18.2* 16.6* 16.6* 17.5*  NEUTROABS 15.8*  --   --   --   --   --   HGB  --  13.2 13.0 13.0 13.8 13.4  HCT  --  37.5* 38.0* 37.5* 40.1 39.2  MCV  --  82.6 83.0 83.3 83.7 82.9  PLT  --  514* 533* 513* 547* 542*   Cardiac Enzymes: No results for input(s): CKTOTAL, CKMB, CKMBINDEX, TROPONINI in the last 168 hours. BNP: BNP (last 3 results) No results for input(s): BNP in the last 8760 hours.  ProBNP (last 3 results) No results for input(s): PROBNP in the last 8760 hours.  CBG: No results for input(s): GLUCAP in the last 168 hours.     Signed:  Dia Crawford, MD [facility] [phone] pager

## 2017-08-19 NOTE — Progress Notes (Signed)
SATURATION QUALIFICATIONS: (This note is used to comply with regulatory documentation for home oxygen)  Patient Saturations on Room Air at Rest = 99%  Patient Saturations on Room Air while Ambulating = 98%  Patient Saturations on 0 Liters of oxygen while Ambulating = 98%  Please briefly explain why patient needs home oxygen: Not needed

## 2017-08-19 NOTE — Progress Notes (Signed)
Nsg Discharge Note  Admit Date:  08/14/2017 Discharge date: 08/19/2017   Alex Maldonado to be D/C'd Home per MD order.  AVS completed.  Copy for chart, and copy for patient signed, and dated. Patient/caregiver able to verbalize understanding.  Discharge Medication: Allergies as of 08/19/2017   No Known Allergies     Medication List    TAKE these medications   albuterol 108 (90 Base) MCG/ACT inhaler Commonly known as:  PROVENTIL HFA;VENTOLIN HFA Inhale 2 puffs into the lungs every 6 (six) hours as needed for wheezing or shortness of breath.   dextromethorphan-guaiFENesin 30-600 MG 12hr tablet Commonly known as:  MUCINEX DM Take 1 tablet by mouth 2 (two) times daily.   levofloxacin 750 MG tablet Commonly known as:  LEVAQUIN Take 1 tablet (750 mg total) by mouth daily. Start taking on:  08/20/2017   multivitamin with minerals Tabs tablet Take 1 tablet by mouth daily.   oxyCODONE-acetaminophen 5-325 MG tablet Commonly known as:  PERCOCET/ROXICET Take 1-2 tablets by mouth every 6 (six) hours as needed for severe pain.       Discharge Assessment: Vitals:   08/19/17 0931 08/19/17 1443  BP:    Pulse:    Resp:    Temp:    SpO2: 99% 98%   Skin clean, dry and intact without evidence of skin break down, no evidence of skin tears noted. IV catheter discontinued intact. Site without signs and symptoms of complications - no redness or edema noted at insertion site, patient denies c/o pain - only slight tenderness at site.  Dressing with slight pressure applied.  D/c Instructions-Education: Discharge instructions given to patient/family with verbalized understanding. D/c education completed with patient/family including follow up instructions, medication list, d/c activities limitations if indicated, with other d/c instructions as indicated by MD - patient able to verbalize understanding, all questions fully answered. Patient instructed to return to ED, call 911, or call MD  for any changes in condition.  Patient escorted via [place], and D/C home via private auto.  Salley Slaughter, RN 08/19/2017 2:57 PM

## 2017-08-20 LAB — CULTURE, BLOOD (ROUTINE X 2)
CULTURE: NO GROWTH
CULTURE: NO GROWTH
SPECIAL REQUESTS: ADEQUATE

## 2017-08-20 NOTE — Telephone Encounter (Signed)
Spoke with patient. His appt has been moved from 1/2 to 1/10 at 930 with PM. Patient is aware that he will need a CXR prior to appt. Order is already in system.   Nothing else needed at time of call.

## 2017-08-27 ENCOUNTER — Inpatient Hospital Stay: Payer: Self-pay | Admitting: Acute Care

## 2017-09-04 ENCOUNTER — Ambulatory Visit (INDEPENDENT_AMBULATORY_CARE_PROVIDER_SITE_OTHER): Payer: Self-pay | Admitting: Pulmonary Disease

## 2017-09-04 ENCOUNTER — Ambulatory Visit (INDEPENDENT_AMBULATORY_CARE_PROVIDER_SITE_OTHER)
Admission: RE | Admit: 2017-09-04 | Discharge: 2017-09-04 | Disposition: A | Discharge status: Home / Self Care | Payer: Self-pay | Source: Ambulatory Visit | Attending: Pulmonary Disease | Admitting: Pulmonary Disease

## 2017-09-04 ENCOUNTER — Encounter: Payer: Self-pay | Admitting: Pulmonary Disease

## 2017-09-04 VITALS — BP 120/78 | HR 75 | Ht 72.0 in | Wt 155.0 lb

## 2017-09-04 DIAGNOSIS — Z8701 Personal history of pneumonia (recurrent): Secondary | ICD-10-CM

## 2017-09-04 DIAGNOSIS — R0602 Shortness of breath: Secondary | ICD-10-CM

## 2017-09-04 NOTE — Patient Instructions (Signed)
We will order a CT chest without contrast in 2 months for follow-up of your pneumonia It is okay to start exercising and work at low intensity. Make sure that you do not start smoking again I will see you back in clinic after the CT scan.

## 2017-09-04 NOTE — Progress Notes (Addendum)
Alex Maldonado    [id number]    05/15/1971  Primary Care Physician:Patient, No Pcp Per  Referring Physician: No referring provider defined for this encounter.  Chief complaint:  Follow up for pneumonia  HPI: 48 year old with history of smoking, heart murmur, hypertension, left-sided empyema requiring drainage in the year 2000.  Admitted in December with right-sided chest pain, hemoptysis.  CT scan notable for necrotizing pneumonia in the right upper lobe.  He was started on Rocephin, Zithromax and vancomycin and discharged on levofloxacin.  His cultures, strep pneumo were negative.  HIV test was nonreactive.  PCCM consulted for evaluation during his hospitalization.  We recommended completion of antibiotic course and follow-up with imaging  He returns to clinic today after discharge.  Reports that his cough and sputum production have improved.  He still has some pain on the right side of his chest.  No more episodes of hemoptysis, fevers or chills.  Pets: Has a dog.  No other pets. Occupation: Works as a Actor. Exposures: Known exposures Smoking history: 3-4 cigarettes/day for past 30 years.  Quit in November 2018. Travel History: Lived in [place] all his life.  No significant travel history.  Outpatient Encounter Medications as of 09/04/2017  Medication Sig  . albuterol (PROVENTIL HFA;VENTOLIN HFA) 108 (90 Base) MCG/ACT inhaler Inhale 2 puffs into the lungs every 6 (six) hours as needed for wheezing or shortness of breath.  . dextromethorphan-guaiFENesin (MUCINEX DM) 30-600 MG 12hr tablet Take 1 tablet by mouth 2 (two) times daily.  . Multiple Vitamin (MULTIVITAMIN WITH MINERALS) TABS tablet Take 1 tablet by mouth daily.  . [DISCONTINUED] levofloxacin (LEVAQUIN) 750 MG tablet Take 1 tablet (750 mg total) by mouth daily.  . [DISCONTINUED] oxyCODONE-acetaminophen (PERCOCET/ROXICET) 5-325 MG tablet Take 1-2 tablets by mouth every 6 (six) hours as needed for severe pain.    No facility-administered encounter medications on file as of 09/04/2017.     Allergies as of 09/04/2017  . (No Known Allergies)    Past Medical History:  Diagnosis Date  . Heart murmur   . Hypertension     No past surgical history on file.  No family history on file.  Social History   Socioeconomic History  . Marital status: Single    Spouse name: Not on file  . Number of children: Not on file  . Years of education: Not on file  . Highest education level: Not on file  Social Needs  . Financial resource strain: Not on file  . Food insecurity - worry: Not on file  . Food insecurity - inability: Not on file  . Transportation needs - medical: Not on file  . Transportation needs - non-medical: Not on file  Occupational History  . Not on file  Tobacco Use  . Smoking status: Former Smoker    Types: Cigarettes  . Smokeless tobacco: Never Used  . Tobacco comment: quit 07/14/17  Substance and Sexual Activity  . Alcohol use: Yes    Comment: beer  . Drug use: Yes    Types: Marijuana  . Sexual activity: Not on file  Other Topics Concern  . Not on file  Social History Narrative  . Not on file    Review of systems: Review of Systems  Constitutional: Negative for fever and chills.  HENT: Negative.   Eyes: Negative for blurred vision.  Respiratory: as per HPI  Cardiovascular: Negative for chest pain and palpitations.  Gastrointestinal: Negative for vomiting, diarrhea, blood per  rectum. Genitourinary: Negative for dysuria, urgency, frequency and hematuria.  Musculoskeletal: Negative for myalgias, back pain and joint pain.  Skin: Negative for itching and rash.  Neurological: Negative for dizziness, tremors, focal weakness, seizures and loss of consciousness.  Endo/Heme/Allergies: Negative for environmental allergies.  Psychiatric/Behavioral: Negative for depression, suicidal ideas and hallucinations.  All other systems reviewed and are negative.  Physical  Exam: Blood pressure 120/78, pulse 75, height 6' (1.829 m), weight 155 lb (70.3 kg), SpO2 97 %. Gen:      No acute distress HEENT:  EOMI, sclera anicteric Neck:     No masses; no thyromegaly Lungs:    Clear to auscultation bilaterally; normal respiratory effort CV:         Regular rate and rhythm; systolic murmurs Abd:      + bowel sounds; soft, non-tender; no palpable masses, no distension Ext:    No edema; adequate peripheral perfusion Skin:      Warm and dry; no rash Neuro: alert and oriented x 3 Psych: normal mood and affect  Data Reviewed: CT chest 08/15/17-masslike consolidation in the right upper lobe with cavitation suspicious for necrotizing pneumonia.  Dilated pulmonary artery, accessory cardiac bronchus. Chest x-ray 09/04/17-mild improvement in right upper lobe consolidation. I have reviewed the images personally.  Assessment:  Follow-up for right upper lobe necrotizing pneumonia 47 year old smoker with history of empyema in the past presenting with right middle lobe, right upper lobe necrotizing pneumonia.  Need to evaluate for malignancy as he is a smoker even though suspicion is low  Clinically and radiographically he continues to improve.  Will follow up with CT scan in 8 weeks to make sure the consolidation continues to get better and there is no underlying malignancy.  Cardiac murmur, dilated pulmonary artery Suspicion for pulmonary hypertension with TR?Marland Kitchen  Will eval with echocardiogram. Discussed with radiology about the findings of dilated arteries. Suggested that the repeat CT be done with contrast to fully eval the pulmonary arteries for any anatomic abnormalities. Suspicion for PE is low so it is ok to wait till end of February when the follow up CT is done.   Plan/Recommendations: - Follow up CT chest with contrast - Echocardiogram  Marshell Garfinkel MD [facility] Pager [phone] 09/04/2017, 9:43 AM  CC: No ref. provider found

## 2017-09-09 ENCOUNTER — Ambulatory Visit (HOSPITAL_COMMUNITY): Payer: Self-pay | Attending: Cardiology

## 2017-09-09 ENCOUNTER — Other Ambulatory Visit: Payer: Self-pay

## 2017-09-09 DIAGNOSIS — I5189 Other ill-defined heart diseases: Secondary | ICD-10-CM | POA: Insufficient documentation

## 2017-09-09 DIAGNOSIS — R0602 Shortness of breath: Secondary | ICD-10-CM | POA: Insufficient documentation

## 2017-09-28 LAB — ACID FAST CULTURE WITH REFLEXED SENSITIVITIES

## 2017-09-28 LAB — ACID FAST CULTURE WITH REFLEXED SENSITIVITIES (MYCOBACTERIA): Acid Fast Culture: NEGATIVE

## 2017-10-21 ENCOUNTER — Ambulatory Visit (INDEPENDENT_AMBULATORY_CARE_PROVIDER_SITE_OTHER)
Admission: RE | Admit: 2017-10-21 | Discharge: 2017-10-21 | Disposition: A | Discharge status: Home / Self Care | Payer: Self-pay | Source: Ambulatory Visit | Attending: Pulmonary Disease | Admitting: Pulmonary Disease

## 2017-10-21 DIAGNOSIS — R0602 Shortness of breath: Secondary | ICD-10-CM

## 2017-10-21 MED ORDER — IOPAMIDOL (ISOVUE-370) INJECTION 76%
80.0000 mL | Freq: Once | INTRAVENOUS | Status: AC | PRN
  Administered 2017-10-21: 80 mL via INTRAVENOUS

## 2017-10-24 ENCOUNTER — Telehealth: Payer: Self-pay | Admitting: Pulmonary Disease

## 2017-10-24 DIAGNOSIS — J850 Gangrene and necrosis of lung: Secondary | ICD-10-CM

## 2017-10-24 NOTE — Telephone Encounter (Signed)
Called pt letting him know the results of the CT scan.  Order placed for pt to have PFT done and scheduled pt for PFT 10/31/17 at 4pm.  Pt expressed understanding. Nothing further needed at this current time.

## 2017-10-29 ENCOUNTER — Telehealth: Payer: Self-pay | Admitting: Pulmonary Disease

## 2017-10-29 NOTE — Telephone Encounter (Signed)
See 3/1 phone note.  Pt already aware of CT results.  Nothing further needed.

## 2017-10-31 ENCOUNTER — Ambulatory Visit (INDEPENDENT_AMBULATORY_CARE_PROVIDER_SITE_OTHER): Payer: No Typology Code available for payment source | Admitting: Pulmonary Disease

## 2017-10-31 DIAGNOSIS — J850 Gangrene and necrosis of lung: Secondary | ICD-10-CM

## 2017-10-31 LAB — PULMONARY FUNCTION TEST
DL/VA % pred: 71 %
DL/VA: 3.31 ml/min/mmHg/L
DLCO UNC % PRED: 55 %
DLCO UNC: 17.93 ml/min/mmHg
FEF 25-75 PRE: 1.47 L/s
FEF 25-75 Post: 1.61 L/sec
FEF2575-%Change-Post: 9 %
FEF2575-%PRED-POST: 45 %
FEF2575-%Pred-Pre: 41 %
FEV1-%Change-Post: 1 %
FEV1-%PRED-POST: 78 %
FEV1-%Pred-Pre: 76 %
FEV1-POST: 2.71 L
FEV1-Pre: 2.66 L
FEV1FVC-%Change-Post: 0 %
FEV1FVC-%Pred-Pre: 82 %
FEV6-%CHANGE-POST: 2 %
FEV6-%PRED-POST: 95 %
FEV6-%Pred-Pre: 93 %
FEV6-PRE: 3.9 L
FEV6-Post: 3.99 L
FEV6FVC-%CHANGE-POST: 0 %
FEV6FVC-%PRED-PRE: 99 %
FEV6FVC-%Pred-Post: 99 %
FVC-%Change-Post: 2 %
FVC-%Pred-Post: 95 %
FVC-%Pred-Pre: 93 %
FVC-Post: 4.08 L
FVC-Pre: 3.98 L
POST FEV1/FVC RATIO: 67 %
POST FEV6/FVC RATIO: 98 %
Pre FEV1/FVC ratio: 67 %
Pre FEV6/FVC Ratio: 98 %
RV % PRED: 109 %
RV: 2.13 L
TLC % pred: 95 %
TLC: 6.64 L

## 2017-10-31 NOTE — Progress Notes (Signed)
Patient completed Full PFT today.

## 2017-11-03 ENCOUNTER — Ambulatory Visit (INDEPENDENT_AMBULATORY_CARE_PROVIDER_SITE_OTHER): Payer: No Typology Code available for payment source | Admitting: Pulmonary Disease

## 2017-11-03 ENCOUNTER — Encounter: Payer: Self-pay | Admitting: Pulmonary Disease

## 2017-11-03 VITALS — BP 126/78 | HR 71 | Ht 72.0 in | Wt 165.0 lb

## 2017-11-03 DIAGNOSIS — J449 Chronic obstructive pulmonary disease, unspecified: Secondary | ICD-10-CM

## 2017-11-03 DIAGNOSIS — J850 Gangrene and necrosis of lung: Secondary | ICD-10-CM

## 2017-11-03 DIAGNOSIS — R0683 Snoring: Secondary | ICD-10-CM

## 2017-11-03 MED ORDER — UMECLIDINIUM-VILANTEROL 62.5-25 MCG/INH IN AEPB: 1.0000 | INHALATION_SPRAY | Freq: Every day | RESPIRATORY_TRACT | 0 refills | Status: DC

## 2017-11-03 MED ORDER — UMECLIDINIUM-VILANTEROL 62.5-25 MCG/INH IN AEPB: 1.0000 | INHALATION_SPRAY | Freq: Every day | RESPIRATORY_TRACT | 3 refills | Status: AC

## 2017-11-03 NOTE — Progress Notes (Addendum)
Alex Maldonado    [id number]    07-May-1971  Primary Care Physician:Patient, No Pcp Per  Referring Physician: No referring provider defined for this encounter.  Chief complaint:  Follow up for pneumonia, COPD, mild pulmonary hypertension  HPI: 47 year old with history of smoking, heart murmur, hypertension, left-sided empyema requiring drainage in the year 2000.  Admitted in December with right-sided chest pain, hemoptysis.  CT scan notable for necrotizing pneumonia in the right upper lobe.  He was started on Rocephin, Zithromax and vancomycin and discharged on levofloxacin.  His cultures, strep pneumo were negative.  HIV test was nonreactive.  PCCM consulted for evaluation during his hospitalization.  We recommended completion of antibiotic course and follow-up with imaging  He returns to clinic today after discharge.  Reports that his cough and sputum production have improved.  He still has some pain on the right side of his chest.  No more episodes of hemoptysis, fevers or chills.  Pets: Has a dog.  No other pets. Occupation: Works as a Actor. Exposures: Known exposures Smoking history: 3-4 cigarettes/day for past 30 years.  Quit in November 2018. Travel History: Lived in [place] all his life.  No significant travel history.  Interim history: He has had a follow-up CT and is here to review Reports stable dyspnea on exertion.  No new complaints today.  Has snoring at night with daytime sleepiness.  Outpatient Encounter Medications as of 11/03/2017  Medication Sig  . albuterol (PROVENTIL HFA;VENTOLIN HFA) 108 (90 Base) MCG/ACT inhaler Inhale 2 puffs into the lungs every 6 (six) hours as needed for wheezing or shortness of breath.  . dextromethorphan-guaiFENesin (MUCINEX DM) 30-600 MG 12hr tablet Take 1 tablet by mouth 2 (two) times daily.  . Multiple Vitamin (MULTIVITAMIN WITH MINERALS) TABS tablet Take 1 tablet by mouth daily.   No facility-administered encounter  medications on file as of 11/03/2017.     Allergies as of 11/03/2017  . (No Known Allergies)    Past Medical History:  Diagnosis Date  . Heart murmur   . Hypertension     No past surgical history on file.  No family history on file.  Social History   Socioeconomic History  . Marital status: Single    Spouse name: Not on file  . Number of children: Not on file  . Years of education: Not on file  . Highest education level: Not on file  Social Needs  . Financial resource strain: Not on file  . Food insecurity - worry: Not on file  . Food insecurity - inability: Not on file  . Transportation needs - medical: Not on file  . Transportation needs - non-medical: Not on file  Occupational History  . Not on file  Tobacco Use  . Smoking status: Former Smoker    Types: Cigarettes  . Smokeless tobacco: Never Used  . Tobacco comment: quit 07/14/17  Substance and Sexual Activity  . Alcohol use: Yes    Comment: beer  . Drug use: Yes    Types: Marijuana  . Sexual activity: Not on file  Other Topics Concern  . Not on file  Social History Narrative  . Not on file    Review of systems: Review of Systems  Constitutional: Negative for fever and chills.  HENT: Negative.   Eyes: Negative for blurred vision.  Respiratory: as per HPI  Cardiovascular: Negative for chest pain and palpitations.  Gastrointestinal: Negative for vomiting, diarrhea, blood per rectum. Genitourinary: Negative  for dysuria, urgency, frequency and hematuria.  Musculoskeletal: Negative for myalgias, back pain and joint pain.  Skin: Negative for itching and rash.  Neurological: Negative for dizziness, tremors, focal weakness, seizures and loss of consciousness.  Endo/Heme/Allergies: Negative for environmental allergies.  Psychiatric/Behavioral: Negative for depression, suicidal ideas and hallucinations.  All other systems reviewed and are negative.  Physical Exam: Blood pressure 126/78, pulse 71, height 6'  (1.829 m), weight 165 lb (74.8 kg), SpO2 99 %. Gen:      No acute distress HEENT:  EOMI, sclera anicteric Neck:     No masses; no thyromegaly Lungs:    Clear to auscultation bilaterally; normal respiratory effort CV:         Regular rate and rhythm; systolic murmur Abd:      + bowel sounds; soft, non-tender; no palpable masses, no distension Ext:    No edema; adequate peripheral perfusion Skin:      Warm and dry; no rash Neuro: alert and oriented x 3 Psych: normal mood and affect  Data Reviewed: CT chest 08/15/17-masslike consolidation in the right upper lobe with cavitation suspicious for necrotizing pneumonia.  Dilated pulmonary artery, accessory cardiac bronchus. CT chest 10/21/17-No pulmonary embolism, enlarged pulmonary artery.  Resolution of right upper lobe consolidation with residual scarring, emphysema. I have reviewed the images personally.  PFTs 10/31/17 FVC 4.08 [95%], FEV1 2.71 [78%], F/F 67, TLC 95%, DLCO 55% Moderate obstruction and diffusion impairment.  Echocardiogram 09/09/17  Normal LV systolic function; mild diastolic dysfunction; mildly   elevated velocity across pulmonic valve (2.5 m/s) suggests mild   PS; trace TR with mild pulmonary hypertension.  Assessment:  Follow-up for right upper lobe necrotizing pneumonia CT scan reviewed with resolution of pneumonia with residual scarring  Cardiac murmur, dilated pulmonary artery Echo reviewed with mild pulmonary stenosis and mild pulmonary hypertension.  No evidence of pulmonary embolism on CT scan. We will continue to monitor this  Moderate COPD PFTs reviewed with moderate obstruction.  Start Anoro He has quit smoking.  Congratulated him and advised him not to resume.  Suspected sleep apnea Schedule for a split-night sleep study.  Plan/Recommendations: -Start Anoro, continue albuterol as needed -Split-night sleep study.  Marshell Garfinkel MD [facility] Pager [phone] 11/03/2017,  9:12 AM  CC: No ref. provider found  Addendum Received CPAP download 4/19-5/18/2019 On 9 cm of water 100% compliance, AHI 1.8 with minimal leaks.  Central apnea index 1.3

## 2017-11-03 NOTE — Patient Instructions (Signed)
Your CT scan shows that the pneumonia has resolved with some residual scarring There is evidence on CT scan and lung function test of mild COPD We will start you on an inhaler called anoro.  Continue albuterol We will schedule you for split-night sleep study Congratulations on quitting smoking Follow-up in 3 months.

## 2017-11-05 DIAGNOSIS — J449 Chronic obstructive pulmonary disease, unspecified: Secondary | ICD-10-CM | POA: Insufficient documentation

## 2017-11-07 ENCOUNTER — Telehealth: Payer: Self-pay | Admitting: Pulmonary Disease

## 2017-11-07 DIAGNOSIS — J449 Chronic obstructive pulmonary disease, unspecified: Secondary | ICD-10-CM

## 2017-11-07 NOTE — Telephone Encounter (Signed)
Hold anoro If worse over weekend> go to ER Otherwise plan OV next week for CXR and to determine what to do with the Anoro

## 2017-11-07 NOTE — Telephone Encounter (Signed)
Called and spoke with patient, he states that since starting Anoro inhaler, when he coughs up phlegm it has traces of blood in it. He is wanting to know what he should do. BQ please advise as Dr. Vaughan Browner is out of the office.

## 2017-11-07 NOTE — Telephone Encounter (Signed)
Spoke with the pt and notified of recs per BQ  He verbalized understanding  Ov and cxr with TP scheduled

## 2017-11-11 ENCOUNTER — Ambulatory Visit (INDEPENDENT_AMBULATORY_CARE_PROVIDER_SITE_OTHER): Payer: No Typology Code available for payment source | Admitting: Adult Health

## 2017-11-11 ENCOUNTER — Encounter: Payer: Self-pay | Admitting: Adult Health

## 2017-11-11 ENCOUNTER — Ambulatory Visit (INDEPENDENT_AMBULATORY_CARE_PROVIDER_SITE_OTHER)
Admission: RE | Admit: 2017-11-11 | Discharge: 2017-11-11 | Disposition: A | Discharge status: Home / Self Care | Payer: No Typology Code available for payment source | Source: Ambulatory Visit | Attending: Adult Health | Admitting: Adult Health

## 2017-11-11 DIAGNOSIS — J449 Chronic obstructive pulmonary disease, unspecified: Secondary | ICD-10-CM

## 2017-11-11 NOTE — Assessment & Plan Note (Signed)
Left without being seen.

## 2017-11-11 NOTE — Progress Notes (Signed)
@Patient  ID: Alex Maldonado, male    DOB: 05/06/1971, 47 y.o.   MRN: [id number]  No chief complaint on file.   Referring provider: No ref. provider found  HPI: Pt left without being seen .   No Known Allergies   There is no immunization history on file for this patient.  Past Medical History:  Diagnosis Date  . Heart murmur   . Hypertension     Tobacco History: Social History   Tobacco Use  Smoking Status Former Smoker  . Types: Cigarettes  Smokeless Tobacco Never Used  Tobacco Comment   quit 07/14/17   Counseling given: Not Answered Comment: quit 07/14/17   Outpatient Encounter Medications as of 11/11/2017  Medication Sig  . albuterol (PROVENTIL HFA;VENTOLIN HFA) 108 (90 Base) MCG/ACT inhaler Inhale 2 puffs into the lungs every 6 (six) hours as needed for wheezing or shortness of breath.  . dextromethorphan-guaiFENesin (MUCINEX DM) 30-600 MG 12hr tablet Take 1 tablet by mouth 2 (two) times daily.  . Multiple Vitamin (MULTIVITAMIN WITH MINERALS) TABS tablet Take 1 tablet by mouth daily.   No facility-administered encounter medications on file as of 11/11/2017.      Review of Systems  Constitutional:   No  weight loss, night sweats,  Fevers, chills, fatigue, or  lassitude.  HEENT:   No headaches,  Difficulty swallowing,  Tooth/dental problems, or  Sore throat,                No sneezing, itching, ear ache, nasal congestion, post nasal drip,   CV:  No chest pain,  Orthopnea, PND, swelling in lower extremities, anasarca, dizziness, palpitations, syncope.   GI  No heartburn, indigestion, abdominal pain, nausea, vomiting, diarrhea, change in bowel habits, loss of appetite, bloody stools.   Resp: No shortness of breath with exertion or at rest.  No excess mucus, no productive cough,  No non-productive cough,  No coughing up of blood.  No change in color of mucus.  No wheezing.  No chest wall deformity  Skin: no rash or lesions.  GU: no dysuria, change in  color of urine, no urgency or frequency.  No flank pain, no hematuria   MS:  No joint pain or swelling.  No decreased range of motion.  No back pain.    Physical Exam  There were no vitals taken for this visit.  GEN: A/Ox3; pleasant , NAD, well nourished    HEENT:  /AT,  EACs-clear, TMs-wnl, NOSE-clear, THROAT-clear, no lesions, no postnasal drip or exudate noted.   NECK:  Supple w/ fair ROM; no JVD; normal carotid impulses w/o bruits; no thyromegaly or nodules palpated; no lymphadenopathy.    RESP  Clear  P & A; w/o, wheezes/ rales/ or rhonchi. no accessory muscle use, no dullness to percussion  CARD:  RRR, no m/r/g, no peripheral edema, pulses intact, no cyanosis or clubbing.  GI:   Soft & nt; nml bowel sounds; no organomegaly or masses detected.   Musco: Warm bil, no deformities or joint swelling noted.   Neuro: alert, no focal deficits noted.    Skin: Warm, no lesions or rashes    Lab Results:  CBC    Component Value Date/Time   WBC 17.5 (H) 08/19/2017 0859   RBC 4.73 08/19/2017 0859   HGB 13.4 08/19/2017 0859   HCT 39.2 08/19/2017 0859   PLT 542 (H) 08/19/2017 0859   MCV 82.9 08/19/2017 0859   MCH 28.3 08/19/2017 0859   MCHC 34.2 08/19/2017 0859  RDW 12.8 08/19/2017 0859   LYMPHSABS 2.9 08/14/2017 2347   MONOABS 1.7 (H) 08/14/2017 2347   EOSABS 0.0 08/14/2017 2347   BASOSABS 0.0 08/14/2017 2347    BMET    Component Value Date/Time   NA 137 08/19/2017 0859   K 3.8 08/19/2017 0859   CL 99 (L) 08/19/2017 0859   CO2 29 08/19/2017 0859   GLUCOSE 161 (H) 08/19/2017 0859   BUN 9 08/19/2017 0859   CREATININE 1.42 (H) 08/19/2017 0859   CALCIUM 9.4 08/19/2017 0859   GFRNONAA 58 (L) 08/19/2017 0859   GFRAA >60 08/19/2017 0859    BNP No results found for: BNP  ProBNP No results found for: PROBNP  Imaging: Dg Chest 2 View  Result Date: 11/11/2017 CLINICAL DATA:  Hemoptysis EXAM: CHEST - 2 VIEW COMPARISON:  October 21, 2017 chest CT and chest  radiograph September 04, 2017 FINDINGS: There has been partial but incomplete clearing of patchy opacity from the right upper lobe. Mild atelectasis remains in this area. There is also mild atelectatic change in the lateral left base. Lungs elsewhere are clear. The heart size and pulmonary vascular normal. Enlargement of the left main pulmonary artery remains. The cardiac silhouette is stable. No adenopathy evident. No bone lesions. IMPRESSION: Mild residual atelectasis in the right upper lobe. Mild atelectasis left base. Lungs elsewhere clear. Heart size normal. Stable prominence left main pulmonary outflow tract. No adenopathy demonstrable. Electronically Signed   By: Lowella Grip III M.D.   On: 11/11/2017 11:23   Ct Angio Chest W/cm &/or Wo Cm  Result Date: 10/21/2017 CLINICAL DATA:  Right upper lobe necrotizing pneumonia. Shortness of breath. EXAM: CT ANGIOGRAPHY CHEST WITH CONTRAST TECHNIQUE: Multidetector CT imaging of the chest was performed using the standard protocol during bolus administration of intravenous contrast. Multiplanar CT image reconstructions and MIPs were obtained to evaluate the vascular anatomy. CONTRAST:  43mL ISOVUE-370 IOPAMIDOL (ISOVUE-370) INJECTION 76% COMPARISON:  08/15/2017 FINDINGS: Cardiovascular: The heart size appears normal. No pericardial effusion. The main pulmonary artery is enlarged measuring 4.5 cm. The left main pulmonary artery measures 3.3 cm in diameter and the right pulmonary artery measures 2 cm. There is no central obstructing pulmonary embolus. No lobar or segmental pulmonary artery filling defects. Mediastinum/Nodes: The trachea appears patent and is midline. Normal appearance of the thyroid gland. Normal appearance of the esophagus. No enlarged mediastinal or hilar lymph nodes. Lungs/Pleura: No pleural effusion. Mild changes of emphysema. There has been interval resolution of right upper lobe airspace consolidation with residual scarring, image 27/6. Upper  Abdomen: No acute abnormality. Musculoskeletal: No chest wall abnormality. No acute or significant osseous findings. There is no focal bone abnormality. Review of the MIP images confirms the above findings. IMPRESSION: 1. No evidence for acute pulmonary embolus. 2. Enlargement pulmonary arteries suggestive of PA hypertension. 3. Resolution of previous right upper lobe infectious process with residual scarring. 4.  Emphysema (ICD10-J43.9). Electronically Signed   By: Kerby Moors M.D.   On: 10/21/2017 09:20     Assessment & Plan:   No problem-specific Assessment & Plan notes found for this encounter.     Rexene Edison, NP 11/11/2017

## 2017-11-16 ENCOUNTER — Ambulatory Visit (HOSPITAL_BASED_OUTPATIENT_CLINIC_OR_DEPARTMENT_OTHER): Payer: No Typology Code available for payment source | Attending: Pulmonary Disease | Admitting: Pulmonary Disease

## 2017-11-16 VITALS — Ht 72.0 in | Wt 175.0 lb

## 2017-11-16 DIAGNOSIS — R0683 Snoring: Secondary | ICD-10-CM | POA: Insufficient documentation

## 2017-11-16 DIAGNOSIS — G4733 Obstructive sleep apnea (adult) (pediatric): Secondary | ICD-10-CM | POA: Insufficient documentation

## 2017-11-18 ENCOUNTER — Telehealth: Payer: Self-pay | Admitting: Pulmonary Disease

## 2017-11-18 NOTE — Telephone Encounter (Signed)
Notes recorded by Melvenia Needles, NP on 11/11/2017 at 11:44 AM EDT Pt left without being seen in office . Says 1 week ago, had blood tinged mucus after using ANORO , resolved with no recurrence after stopped using ANORO  Feels good with no breathing issues.  CXR is stable . Shows atx/scarring similar to CT chest last month .  Pt to call back if recurrs or issues with breathing  Keep follow up with Dr. Vaughan Browner  Please contact office for sooner follow up if symptoms do not improve or worsen or seek emergency care   Advised pt of results. Pt understood and nothing further is needed.

## 2017-11-24 DIAGNOSIS — G4730 Sleep apnea, unspecified: Secondary | ICD-10-CM

## 2017-11-24 NOTE — Procedures (Signed)
Patient Name: Alex Maldonado, Alex Maldonado Date: 11/16/2017 Gender: Male D.O.B: July 09, 1971 Age (years): 46 Referring Provider: Marshell Garfinkel Height (inches): 72 Interpreting Physician: Kara Mead MD, ABSM Weight (lbs): 175 RPSGT: Jorge Ny BMI: 24 MRN: [id number] Neck Size: 15.00 <br> <br> CLINICAL INFORMATION Sleep Study Type: Split Night CPAP  Indication for sleep study: COPD, Snoring  Epworth Sleepiness Score: 13  SLEEP STUDY TECHNIQUE As per the AASM Manual for the Scoring of Sleep and Associated Events v2.3 (April 2016) with a hypopnea requiring 4% desaturations.  The channels recorded and monitored were frontal, central and occipital EEG, electrooculogram (EOG), submentalis EMG (chin), nasal and oral airflow, thoracic and abdominal wall motion, anterior tibialis EMG, snore microphone, electrocardiogram, and pulse oximetry. Continuous positive airway pressure (CPAP) was initiated when the patient met split night criteria and was titrated according to treat sleep-disordered breathing.  MEDICATIONS Medications self-administered by patient taken the night of the study : N/A  RESPIRATORY PARAMETERS Diagnostic  Total AHI (/hr): 17.0 RDI (/hr): 17.8 OA Index (/hr): 0.4 CA Index (/hr): 0.8 REM AHI (/hr): 55.4 NREM AHI (/hr): 13.3 Supine AHI (/hr): 17.0 Non-supine AHI (/hr): N/A Min O2 Sat (%): 83.0 Mean O2 (%): 90.3 Time below 88% (min): 22.8   Titration  Optimal Pressure (cm): 9 AHI at Optimal Pressure (/hr): 1.5 Min O2 at Optimal Pressure (%): 89.0 Supine % at Optimal (%): 100 Sleep % at Optimal (%): 97   SLEEP ARCHITECTURE The recording time for the entire night was 412.3 minutes.  During a baseline period of 155.5 minutes, the patient slept for 148.5 minutes in REM and nonREM, yielding a sleep efficiency of 95.5%%. Sleep onset after lights out was 4.8 minutes with a REM latency of 68.0 minutes. The patient spent 2.4%% of the night in stage N1 sleep, 88.9%% in stage N2  sleep, 0.0%% in stage N3 and 8.8%% in REM.    During the titration period of 246.3 minutes, the patient slept for 228.5 minutes in REM and nonREM, yielding a sleep efficiency of 92.8%%. Sleep onset after CPAP initiation was 13.4 minutes with a REM latency of 39.0 minutes. The patient spent 4.6%% of the night in stage N1 sleep, 85.1%% in stage N2 sleep, 0.0%% in stage N3 and 10.3%% in REM.  CARDIAC DATA The 2 lead EKG demonstrated sinus rhythm. The mean heart rate was 100.0 beats per minute. Other EKG findings include: None.   LEG MOVEMENT DATA The total Periodic Limb Movements of Sleep (PLMS) were 0. The PLMS index was 0.0 .  IMPRESSIONS - Moderate obstructive sleep apnea occurred during the diagnostic portion of the study(AHI = 17.0/hour). Events were only noted in the supine position (no other sleep position noted) - An optimal PAP pressure was selected for this patient ( 9 cm of water) - No significant central sleep apnea occurred during the diagnostic portion of the study (CAI = 0.8/hour). - Mild oxygen desaturation was noted during the diagnostic portion of the study (Min O2 = 83.0%). - The patient snored with moderate snoring volume during the diagnostic portion of the study. - No cardiac abnormalities were noted during this study. - Clinically significant periodic limb movements did not occur during sleep.   DIAGNOSIS - Obstructive Sleep Apnea (327.23 [G47.33 ICD-10])   RECOMMENDATIONS - Trial of CPAP therapy on 9 cm H2O with a Small size Resmed Full Face Mask AirFit F20 mask and heated humidification. - Avoid alcohol, sedatives and other CNS depressants that may worsen sleep apnea and disrupt normal sleep architecture. - Sleep  hygiene should be reviewed to assess factors that may improve sleep quality. - Weight management and regular exercise should be initiated or continued. - Return to [facility] Center for re-evaluation after 4 weeks of therapy    Kara Mead MD Board  Certified in [place]

## 2017-11-25 ENCOUNTER — Telehealth: Payer: Self-pay | Admitting: Pulmonary Disease

## 2017-11-25 NOTE — Telephone Encounter (Signed)
Called and spoke with patient regarding sleep study Advised patient that sleep study on 11/16/2017 has not been reviewed at this time Once sleep study is reviewed; s/o will call him with the results Pt verbalized understanding, and had no further questions. Nothing further needed at this time

## 2017-12-01 ENCOUNTER — Telehealth: Payer: Self-pay | Admitting: Pulmonary Disease

## 2017-12-01 ENCOUNTER — Other Ambulatory Visit: Payer: Self-pay

## 2017-12-01 DIAGNOSIS — G4733 Obstructive sleep apnea (adult) (pediatric): Secondary | ICD-10-CM

## 2017-12-01 NOTE — Telephone Encounter (Signed)
Spoke with the pt and explained the purpose of CPAP therapy  He verbalized understanding  Nothing further needed

## 2018-02-09 ENCOUNTER — Ambulatory Visit: Payer: No Typology Code available for payment source | Admitting: Pulmonary Disease

## 2018-03-03 ENCOUNTER — Other Ambulatory Visit (INDEPENDENT_AMBULATORY_CARE_PROVIDER_SITE_OTHER): Payer: No Typology Code available for payment source

## 2018-03-03 ENCOUNTER — Ambulatory Visit (INDEPENDENT_AMBULATORY_CARE_PROVIDER_SITE_OTHER)
Admission: RE | Admit: 2018-03-03 | Discharge: 2018-03-03 | Disposition: A | Discharge status: Home / Self Care | Payer: No Typology Code available for payment source | Source: Ambulatory Visit | Attending: Pulmonary Disease | Admitting: Pulmonary Disease

## 2018-03-03 ENCOUNTER — Ambulatory Visit (INDEPENDENT_AMBULATORY_CARE_PROVIDER_SITE_OTHER): Payer: No Typology Code available for payment source | Admitting: Pulmonary Disease

## 2018-03-03 ENCOUNTER — Encounter: Payer: Self-pay | Admitting: Pulmonary Disease

## 2018-03-03 VITALS — BP 128/72 | HR 73 | Ht 72.0 in | Wt 176.2 lb

## 2018-03-03 DIAGNOSIS — J449 Chronic obstructive pulmonary disease, unspecified: Secondary | ICD-10-CM

## 2018-03-03 DIAGNOSIS — Z23 Encounter for immunization: Secondary | ICD-10-CM

## 2018-03-03 LAB — CBC WITH DIFFERENTIAL/PLATELET
BASOS ABS: 0 10*3/uL (ref 0.0–0.1)
BASOS PCT: 0.7 % (ref 0.0–3.0)
Eosinophils Absolute: 0.1 10*3/uL (ref 0.0–0.7)
Eosinophils Relative: 2.2 % (ref 0.0–5.0)
HEMATOCRIT: 43.5 % (ref 39.0–52.0)
Hemoglobin: 15 g/dL (ref 13.0–17.0)
LYMPHS PCT: 26.3 % (ref 12.0–46.0)
Lymphs Abs: 1.7 10*3/uL (ref 0.7–4.0)
MCHC: 34.5 g/dL (ref 30.0–36.0)
MCV: 83.5 fl (ref 78.0–100.0)
MONOS PCT: 11.2 % (ref 3.0–12.0)
Monocytes Absolute: 0.7 10*3/uL (ref 0.1–1.0)
NEUTROS ABS: 3.8 10*3/uL (ref 1.4–7.7)
Neutrophils Relative %: 59.6 % (ref 43.0–77.0)
PLATELETS: 259 10*3/uL (ref 150.0–400.0)
RBC: 5.21 Mil/uL (ref 4.22–5.81)
RDW: 14.2 % (ref 11.5–15.5)
WBC: 6.5 10*3/uL (ref 4.0–10.5)

## 2018-03-03 NOTE — Patient Instructions (Signed)
We will get a chest x-ray, CBC with differential, alpha-1 antitrypsin levels and phenotype today We will give you a Pneumovax.  Make sure you get the flu vaccine every year during the fall season. Continue albuterol as needed  Follow-up in 6 months.

## 2018-03-03 NOTE — Addendum Note (Signed)
Addended by: Maryanna Shape A on: 03/03/2018 11:44 AM   Modules accepted: Orders

## 2018-03-03 NOTE — Progress Notes (Signed)
Alex Maldonado    [id number]    [birth year]  Primary Care Physician:Patient, No Pcp Per  Referring Physician: No referring provider defined for this encounter.  Chief complaint:  Follow up for pneumonia, COPD, mild pulmonary hypertension  HPI: 47 year old with history of smoking, heart murmur, hypertension, left-sided empyema requiring drainage in the year 2000.  Admitted in December with right-sided chest pain, hemoptysis.  CT scan notable for necrotizing pneumonia in the right upper lobe.  He was started on Rocephin, Zithromax and vancomycin and discharged on levofloxacin.  His cultures, strep pneumo were negative.  HIV test was nonreactive.  PCCM consulted for evaluation during his hospitalization.  We recommended completion of antibiotic course and follow-up with imaging  He returns to clinic today after discharge.  Reports that his cough and sputum production have improved.  He still has some pain on the right side of his chest.  No more episodes of hemoptysis, fevers or chills.  Pets: Has a dog.  No other pets. Occupation: Works as a Visual merchandiser. Exposures: Known exposures Smoking history: 3-4 cigarettes/day for past 30 years.  Quit in November 2018. Travel History: Lived in [place] all his life.  No significant travel history.  Interim history: Started on anoro in March 2019.  He developed minor hemoptysis after that.  Scheduled for pulmonary visit but left without being seen.  He did however get a chest x-ray which showed stable scarring in the right lung. He has since stopped anoro and states that the hemoptysis is completely resolved.  He is doing well with respect to his breathing.  He has no problems doing work which involves lifting heavy objects as a Actor He has an albuterol inhaler which he uses about 2 -3 time/week  Started on CPAP for sleep apnea.  He is tolerating well with good response.  Outpatient Encounter Medications as of 03/03/2018  Medication  Sig  . albuterol (PROVENTIL HFA;VENTOLIN HFA) 108 (90 Base) MCG/ACT inhaler Inhale 2 puffs into the lungs every 6 (six) hours as needed for wheezing or shortness of breath.  . Multiple Vitamin (MULTIVITAMIN WITH MINERALS) TABS tablet Take 1 tablet by mouth daily.  . [DISCONTINUED] dextromethorphan-guaiFENesin (MUCINEX DM) 30-600 MG 12hr tablet Take 1 tablet by mouth 2 (two) times daily.   No facility-administered encounter medications on file as of 03/03/2018.     Allergies as of 03/03/2018  . (No Known Allergies)    Past Medical History:  Diagnosis Date  . Heart murmur   . Hypertension     No past surgical history on file.  No family history on file.  Social History   Socioeconomic History  . Marital status: Single    Spouse name: Not on file  . Number of children: Not on file  . Years of education: Not on file  . Highest education level: Not on file  Occupational History  . Not on file  Social Needs  . Financial resource strain: Not on file  . Food insecurity:    Worry: Not on file    Inability: Not on file  . Transportation needs:    Medical: Not on file    Non-medical: Not on file  Tobacco Use  . Smoking status: Former Smoker    Types: Cigarettes  . Smokeless tobacco: Never Used  . Tobacco comment: quit 07/14/17  Substance and Sexual Activity  . Alcohol use: Yes    Comment: beer  . Drug use: Yes  Types: Marijuana  . Sexual activity: Not on file  Lifestyle  . Physical activity:    Days per week: Not on file    Minutes per session: Not on file  . Stress: Not on file  Relationships  . Social connections:    Talks on phone: Not on file    Gets together: Not on file    Attends religious service: Not on file    Active member of club or organization: Not on file    Attends meetings of clubs or organizations: Not on file    Relationship status: Not on file  . Intimate partner violence:    Fear of current or ex partner: Not on file    Emotionally abused:  Not on file    Physically abused: Not on file    Forced sexual activity: Not on file  Other Topics Concern  . Not on file  Social History Narrative  . Not on file    Review of systems: Review of Systems  Constitutional: Negative for fever and chills.  HENT: Negative.   Eyes: Negative for blurred vision.  Respiratory: as per HPI  Cardiovascular: Negative for chest pain and palpitations.  Gastrointestinal: Negative for vomiting, diarrhea, blood per rectum. Genitourinary: Negative for dysuria, urgency, frequency and hematuria.  Musculoskeletal: Negative for myalgias, back pain and joint pain.  Skin: Negative for itching and rash.  Neurological: Negative for dizziness, tremors, focal weakness, seizures and loss of consciousness.  Endo/Heme/Allergies: Negative for environmental allergies.  Psychiatric/Behavioral: Negative for depression, suicidal ideas and hallucinations.  All other systems reviewed and are negative.  Physical Exam: Blood pressure 128/72, pulse 73, height 6' (1.829 m), weight 176 lb 3.2 oz (79.9 kg), SpO2 95 %. Gen:      No acute distress HEENT:  EOMI, sclera anicteric Neck:     No masses; no thyromegaly Lungs:    Clear to auscultation bilaterally; normal respiratory effort CV:         Regular rate and rhythm; no murmurs Abd:      + bowel sounds; soft, non-tender; no palpable masses, no distension Ext:    No edema; adequate peripheral perfusion Skin:      Warm and dry; no rash Neuro: alert and oriented x 3 Psych: normal mood and affect  Data Reviewed: Imaging CT chest 08/15/17-masslike consolidation in the right upper lobe with cavitation suspicious for necrotizing pneumonia.  Dilated pulmonary artery, accessory cardiac bronchus. CT chest 10/21/17-No pulmonary embolism, enlarged pulmonary artery.  Resolution of right upper lobe consolidation with residual scarring, emphysema. Chest x-ray 3/90/19- mild atelectasis in the right upper lobe, left base. I have  reviewed the images personally.  PFTs 10/31/17 FVC 4.08 [95%], FEV1 2.71 [78%], F/F 67, TLC 95%, DLCO 55% Moderate obstruction and diffusion impairment.   Echocardiogram 09/09/17  Normal LV systolic function; mild diastolic dysfunction; mildly   elevated velocity across pulmonic valve (2.5 m/s) suggests mild   PS; trace TR with mild pulmonary hypertension.  Sleep study  11/16/2017 Moderate OSA, AHI 17 with mild oxygen desaturation to 83%., optimal PAP pressure 9 cm.  CPAP download 02/02/2018-02/28/2018 100% usage.  Average was seen in the office Set pressure 9 cm, residual AHI 2.7, 0 leaks  Assessment:  Moderate COPD PFTs reviewed with moderate obstruction.  Stopped anoro after he developed minor hemoptysis. Symptoms are stable on albuterol as needed.  Observe off controller medication. He has quit smoking.  Congratulated him and advised him not to resume.  Check CBC differential, alpha-1 antitrypsin levels  and phenotype  Hemoptysis No recurrence after he stopped the inhaler.  Repeat chest x-ray today  Cardiac murmur, dilated pulmonary artery Echo reviewed with mild pulmonary stenosis and mild pulmonary hypertension.  No evidence of pulmonary embolism on CT scan. We will continue to monitor this  Follow-up for right upper lobe necrotizing pneumonia CT scan reviewed with resolution of pneumonia with residual scarring  Moderate OSA Started on CPAP. Download reviewed with good compliance and response to therapy.  Health maintenance Give Pneumovax today. Recommended that he get the flu shot every year.  Plan/Recommendations: - Continue albuterol as needed - Chest x-ray, CBC differential, alpha-1 antitrypsin levels and phenotype - Continue CPAP - Pneumovax  Marshell Garfinkel MD [facility] 03/03/2018, 11:18 AM  CC: No ref. provider found

## 2018-03-07 LAB — ALPHA-1 ANTITRYPSIN PHENOTYPE: A1 ANTITRYPSIN SER: 128 mg/dL (ref 83–199)

## 2018-10-23 ENCOUNTER — Encounter (HOSPITAL_COMMUNITY): Payer: Self-pay | Admitting: Emergency Medicine

## 2018-10-23 ENCOUNTER — Emergency Department (HOSPITAL_COMMUNITY)
Admission: EM | Admit: 2018-10-23 | Discharge: 2018-10-24 | Disposition: A | Discharge status: Home / Self Care | Payer: BLUE CROSS/BLUE SHIELD | Attending: Emergency Medicine | Admitting: Emergency Medicine

## 2018-10-23 ENCOUNTER — Other Ambulatory Visit: Payer: Self-pay

## 2018-10-23 DIAGNOSIS — Z87891 Personal history of nicotine dependence: Secondary | ICD-10-CM | POA: Diagnosis not present

## 2018-10-23 DIAGNOSIS — I10 Essential (primary) hypertension: Secondary | ICD-10-CM | POA: Insufficient documentation

## 2018-10-23 DIAGNOSIS — M778 Other enthesopathies, not elsewhere classified: Secondary | ICD-10-CM | POA: Diagnosis not present

## 2018-10-23 DIAGNOSIS — M25521 Pain in right elbow: Secondary | ICD-10-CM | POA: Diagnosis present

## 2018-10-23 DIAGNOSIS — J449 Chronic obstructive pulmonary disease, unspecified: Secondary | ICD-10-CM | POA: Insufficient documentation

## 2018-10-23 HISTORY — DX: Chronic obstructive pulmonary disease, unspecified: J44.9

## 2018-10-23 MED ORDER — PREDNISONE 20 MG PO TABS: ORAL_TABLET | ORAL | 0 refills | Status: DC

## 2018-10-23 MED ORDER — PREDNISONE 20 MG PO TABS
60.0000 mg | ORAL_TABLET | Freq: Once | ORAL | Status: AC
  Administered 2018-10-23: 60 mg via ORAL
  Filled 2018-10-23: qty 3

## 2018-10-23 NOTE — ED Provider Notes (Signed)
[facility] EMERGENCY DEPARTMENT Provider Note   CSN: [id number] Arrival date & time: 10/23/18  1948    History   Chief Complaint Chief Complaint  Patient presents with  . Elbow Pain    HPI Alex Maldonado is a 48 y.o. male.     The history is provided by the patient and medical records.     48 y.o. M with hx of COPD, heart murmur, HTN, presenting to the ED for bilateral elbow pain, right worse than left.  This has been ongoing for about 3 weeks now.  States pain has been worsening.  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  States pain worsens as the days go on, pain is better on his days off.  He did try some bio freeze he found at work and wearing elbow brace without much relief.  Past Medical History:  Diagnosis Date  . COPD (chronic obstructive pulmonary disease) ([place])   . Heart murmur   . Hypertension     Patient Active Problem List   Diagnosis Date Noted  . COPD with chronic bronchitis and emphysema ([place]) 11/05/2017  . Tobacco abuse   . Right upper lobe pneumonia ([place]) 08/15/2017  . Necrotizing pneumonia ([place]) 08/15/2017  . Sepsis ([place]) 08/15/2017  . Heart murmur 08/15/2017  . Malnutrition of moderate degree 08/15/2017    History reviewed. No pertinent surgical history.      Home Medications    Prior to Admission medications   Medication Sig Start Date End Date Taking? Authorizing Provider  albuterol (PROVENTIL HFA;VENTOLIN HFA) 108 (90 Base) MCG/ACT inhaler Inhale 2 puffs into the lungs every 6 (six) hours as needed for wheezing or shortness of breath. 08/19/17   Allie Bossier, MD  Multiple Vitamin (MULTIVITAMIN WITH MINERALS) TABS tablet Take 1 tablet by mouth daily.    [provider]    Family History No family history on file.  Social History Social History   Tobacco Use  . Smoking status: Former Smoker    Types:  Cigarettes  . Smokeless tobacco: Never Used  . Tobacco comment: quit 07/14/17  Substance Use Topics  . Alcohol use: Yes    Comment: beer  . Drug use: Yes    Types: Marijuana     Allergies   Patient has no known allergies.   Review of Systems Review of Systems  Musculoskeletal: Positive for arthralgias.  All other systems reviewed and are negative.    Physical Exam Updated Vital Signs BP 133/79 (BP Location: Left Arm)   Pulse 73   Temp 98 F (36.7 C) (Oral)   Resp 19   SpO2 100%   Physical Exam Vitals signs and nursing note reviewed.  Constitutional:      Appearance: He is well-developed.  HENT:     Head: Normocephalic and atraumatic.  Eyes:     Conjunctiva/sclera: Conjunctivae normal.     Pupils: Pupils are equal, round, and reactive to light.  Neck:     Musculoskeletal: Normal range of motion.  Cardiovascular:     Rate and Rhythm: Normal rate and regular rhythm.     Heart sounds: Normal heart sounds.  Pulmonary:     Effort: Pulmonary effort is normal.     Breath sounds: Normal breath sounds.  Abdominal:     General: Bowel sounds are normal.     Palpations: Abdomen is soft.  Musculoskeletal: Normal  range of motion.     Comments: Bilateral elbows overall normal in appearance without swelling or bony deformity, normal flexion/extension but with some pain elicited when doing so; normal grip strength bilaterally; normal distal sensation and perfusion  Skin:    General: Skin is warm and dry.  Neurological:     Mental Status: He is alert and oriented to person, place, and time.      ED Treatments / Results  Labs (all labs ordered are listed, but only abnormal results are displayed) Labs Reviewed - No data to display  EKG None  Radiology No results found.  Procedures Procedures (including critical care time)  Medications Ordered in ED Medications - No data to display   Initial Impression / Assessment and Plan / ED Course  I have reviewed the  triage vital signs and the nursing notes.  Pertinent labs & imaging results that were available during my care of the patient were reviewed by me and considered in my medical decision making (see chart for details).  48 year old male presenting to the ED with bilateral elbow pain for the past 3 weeks.  Right elbow is somewhat worse than left.  No swelling, bony deformity, overlying skin changes noted to either elbow.  He maintains full flexion and extension but there is some pain when doing so.  Normal grip strength bilaterally, normal distal sensation and perfusion.  Patient does work 12-hour days doing repetitive lifting motions during his entire shift.  Suspect he likely has tendinitis of his elbows.  Does report that right is worse, he is right-hand dominant.  Discussed symptomatic care.  Patient's kidney function is borderline, last serum creatinine was 1.42 so we will try to avoid anti-inflammatories.  Will start on prednisone taper, can continue wearing wrist braces.  Does not currently have PCP but does have Medicaid, given information to help establish care.  Can return here for any new/acute changes.  Final Clinical Impressions(s) / ED Diagnoses   Final diagnoses:  Tendonitis of both elbows    ED Discharge Orders         Ordered    predniSONE (DELTASONE) 20 MG tablet     10/23/18 2359           Larene Pickett, PA-C 10/24/18 0003    Hayden Rasmussen, MD 10/24/18 [phone]

## 2018-10-23 NOTE — ED Triage Notes (Signed)
Patient reports worsening bilateral elbow pain for several weeks ,denies injury , he endorses heavy lifting at work .

## 2018-10-24 NOTE — Discharge Instructions (Signed)
Take the prescribed medication as directed. Follow-up with primary care doctor-- you can call the 800 number on paperwork for assistance finding a physician for your insurance plan. Return to the ED for new or worsening symptoms.

## 2018-11-02 ENCOUNTER — Ambulatory Visit: Payer: BLUE CROSS/BLUE SHIELD | Admitting: Family Medicine

## 2018-11-11 ENCOUNTER — Telehealth: Payer: Self-pay | Admitting: Pulmonary Disease

## 2018-11-11 NOTE — Telephone Encounter (Signed)
Patient states that he is unhappy with this conclusion that his work does not provided him with ppe to work in. But he would no longer need our services and would find a new provider. Nothing further is need

## 2018-11-11 NOTE — Telephone Encounter (Signed)
Please call patient and let him know from our perspective there is no reason for him not to work. He needs to follow Personal protective equipment recommendations from his employer and all of the safety recommendations they provide.   Thanks so much.  Dr. Valeta Harms was consulted in this decision making process.

## 2018-11-11 NOTE — Telephone Encounter (Signed)
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 Patient is concerned because he has a heart murmur as well as a URI and being exposed to this virus daily. Patient wants to know if he can have a letter or something stating he does not need to work.   SG please advise, thank you.

## 2018-11-16 ENCOUNTER — Ambulatory Visit (HOSPITAL_COMMUNITY)
Admission: EM | Admit: 2018-11-16 | Discharge: 2018-11-16 | Disposition: A | Discharge status: Home / Self Care | Payer: BLUE CROSS/BLUE SHIELD | Attending: Internal Medicine | Admitting: Internal Medicine

## 2018-11-16 ENCOUNTER — Encounter (HOSPITAL_COMMUNITY): Payer: Self-pay | Admitting: Emergency Medicine

## 2018-11-16 ENCOUNTER — Other Ambulatory Visit: Payer: Self-pay

## 2018-11-16 DIAGNOSIS — R69 Illness, unspecified: Secondary | ICD-10-CM

## 2018-11-16 DIAGNOSIS — J111 Influenza due to unidentified influenza virus with other respiratory manifestations: Secondary | ICD-10-CM

## 2018-11-16 MED ORDER — OSELTAMIVIR PHOSPHATE 75 MG PO CAPS: 75.0000 mg | ORAL_CAPSULE | Freq: Two times a day (BID) | ORAL | 0 refills | Status: DC

## 2018-11-16 NOTE — ED Provider Notes (Signed)
[facility]    CSN: [id number] Arrival date & time: 11/16/18  1615     History   Chief Complaint Chief Complaint  Patient presents with  . Influenza    HPI Alex Maldonado is a 48 y.o. male.   Onset of Saturday woke up with ST which lasted til yesterday and today is gone. Since yesterday he developed rhinitis and cough. Had body aches for the first 2 days, but is not aching today. Has had his flu shot this season and his pneumonia shot. He is worried because he has COPD.  Has hx of HTN but he d/c his med, but has not checked what runs and does not have a PCP.  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 He does not use a mask, eye protector or gown. Has had to his mother who had a cold and did not have a fever and now she is well.     Past Medical History:  Diagnosis Date  . COPD (chronic obstructive pulmonary disease) ([place])   . Heart murmur   . Hypertension     Patient Active Problem List   Diagnosis Date Noted  . COPD with chronic bronchitis and emphysema ([place]) 11/05/2017  . Tobacco abuse   . Right upper lobe pneumonia ([place]) 08/15/2017  . Necrotizing pneumonia ([place]) 08/15/2017  . Sepsis ([place]) 08/15/2017  . Heart murmur 08/15/2017  . Malnutrition of moderate degree 08/15/2017   History reviewed. No pertinent surgical history.   Home Medications    Prior to Admission medications   Medication Sig Start Date End Date Taking? Authorizing Provider  albuterol (PROVENTIL HFA;VENTOLIN HFA) 108 (90 Base) MCG/ACT inhaler Inhale 2 puffs into the lungs every 6 (six) hours as needed for wheezing or shortness of breath. 08/19/17   Allie Bossier, MD  Multiple Vitamin (MULTIVITAMIN WITH MINERALS) TABS tablet Take 1 tablet by mouth daily.     [provider]  oseltamivir (TAMIFLU) 75 MG capsule Take 1 capsule (75 mg total) by mouth every 12 (twelve) hours. 11/16/18   Rodriguez-Southworth, Sunday Spillers, PA-C    Family History History reviewed. No pertinent family history.  Social History Social History   Tobacco Use  . Smoking status: Former Smoker    Types: Cigarettes  . Smokeless tobacco: Never Used  . Tobacco comment: quit 07/14/17  Substance Use Topics  . Alcohol use: Yes    Comment: beer  . Drug use: Yes    Types: Marijuana     Allergies   Patient has no known allergies.   Review of Systems Review of Systems  Constitutional: Positive for fever. Negative for chills and fatigue.  HENT: Positive for congestion, postnasal drip and rhinorrhea. Negative for ear discharge, ear pain and sore throat.   Respiratory: Positive for cough. Negative for chest tightness, shortness of breath and wheezing.   Musculoskeletal: Positive for myalgias.  Skin: Negative for rash.  Neurological: Positive for headaches.   Physical Exam Triage Vital Signs ED Triage Vitals  Enc Vitals Group     BP 11/16/18 1647 (!) 169/99     Pulse Rate 11/16/18 1647 (!) 104     Resp 11/16/18 1647 18     Temp 11/16/18 1647 99.6 F (37.6 C)     Temp Source  11/16/18 1647 Oral     SpO2 11/16/18 1647 100 %     Weight --      Height --      Head Circumference --      Peak Flow --      Pain Score 11/16/18 1648 5     Pain Loc --      Pain Edu? --      Excl. in [place]? --    No data found.  Updated Vital Signs BP (!) 169/99 (BP Location: Right Arm)   Pulse (!) 104   Temp 99.6 F (37.6 C) (Oral)   Resp 18   SpO2 100%  Repeated BP at discharge 138/84 Visual Acuity Right Eye Distance:   Left Eye Distance:   Bilateral Distance:    Right Eye Near:   Left Eye Near:    Bilateral Near:     Physical Exam Alert, well kept in NAD. He looks a little sick but not toxic EYES- non icterus, conjunctiva's are normal.  ENT- external ears and  nose are normal, TM's gray and shiny, Nose with clear mucous and            Mild congestion bilaterally. Throat- clear. No sinus tenderness.  NECK- supple with no nodes LUNG- clear, with no wheezing, rhonchi or rales. Negative egophony.  HEART- RRR with no murmurs SKIN- non jaundiced, no rashes or ecchymosis.    UC Treatments / Results  Labs (all labs ordered are listed, but only abnormal results are displayed) Labs Reviewed - No data to display  EKG None  Radiology No results found.  Procedures Procedures   Medications Ordered in UC Medications - No data to display  Initial Impression / Assessment and Plan / UC Course  I have reviewed the triage vital signs and the nursing notes. We are out of Flu test to send out, I explained this to him, so I will treat him for the flu, but could still have Corona virus. I reassured him that his PO2 is perfect and his lungs sounds are completely normal, but if he gets SOB, wheezing, or high fevers over 100.4 needs to go to the hospital.  He was given information about Corona virus protocol and needs to follow it. No work until he does not have a fever for 3 days. If he or employer insists in getting him testing, he can call [facility] and see if they are still testing otherwise needs to call the health dept. Pt is understanding.  Final Clinical Impressions(s) / UC Diagnoses   Final diagnoses:  Influenza-like illness     Discharge Instructions     WE ARE OUT OF FLU TEST, SO  I  will treat you for influenza since you have typical symptoms of this. Follow the corana virus instructions I gave you.  You may not return to work until you have not had a fever for 3 days.  You may take Tylenol as needed or aches and fever or Chloracedin for cold symptoms    ED Prescriptions    Medication Sig Dispense Auth. Provider   oseltamivir (TAMIFLU) 75 MG capsule Take 1 capsule (75 mg total) by mouth every 12 (twelve) hours. 10 capsule Rodriguez-Southworth,  Sunday Spillers, PA-C     Controlled Substance Prescriptions [place] Controlled Substance Registry consulted?    Shelby Mattocks, PA-C 11/16/18 1755

## 2018-11-16 NOTE — Discharge Instructions (Addendum)
WE ARE OUT OF FLU TEST, SO  I  will treat you for influenza since you have typical symptoms of this. Follow the corana virus instructions I gave you.  You may not return to work until you have not had a fever for 3 days.  You may take Tylenol as needed or aches and fever or Chloracedin for cold symptoms

## 2018-11-16 NOTE — ED Triage Notes (Signed)
Pt here for flu like sx x 3 days

## 2018-12-02 ENCOUNTER — Telehealth: Payer: Self-pay | Admitting: Pulmonary Disease

## 2018-12-02 NOTE — Progress Notes (Signed)
Virtual Visit via Telephone Note  I connected with Alex Maldonado on 12/03/18 at 11:00 AM EDT by telephone and verified that I am speaking with the correct person using two identifiers.   I discussed the limitations, risks, security and privacy concerns of performing an evaluation and management service by telephone and the availability of in person appointments. I also discussed with the patient that there may be a patient responsible charge related to this service. The patient expressed understanding and agreed to proceed.   History of Present Illness:  48 year old former smoker followed in our office for emphysema.  Patient also was treated in December/2018 for a necrotizing pneumonia in his right upper lobe, patient was treated with Rocephin, Zithromax and vancomycin and discharged on Levaquin.  HIV test was nonreactive. Past medical history: Heart murmur Pt of Dr. Vaughan Browner  Patient consented to consult via telephone: Yes  People present and their role in pt care: Pt   Chief complaint: Follow-up from urgent care visit on 11/16/2018, cough, fever  48 year old male former smoker followed in our office for emphysema.  Patient reports as well as chart review confirms that on 11/16/2018 patient was seen in urgent care and was treated for flulike symptoms.  They were out of flu swabs they did not swab the patient for flu.  Patient took Tamiflu and was treated for influenza infection.  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  Patient does not wear a mask.  Patient reports that they do have a [employer] and the patient wears gloves while handling this equipment.  Urgent care provider counseled patient that this could in fact be a COVID-19 infection but recommended isolation and that he could not return to work until he did not have fevers for 3 days straight without antipyretics.  Patient reports that overall his body aches and  symptoms have improved.  Unfortunately the patient is still having intermittent fevers.  He reports the last time he was able to take his temperature was on 11/28/2018 and is still a low-grade temp of 99 5.  Since then his thermometers ran out of batteries.  Patient reports he has been taking Tylenol Cold and flu daily.  So likely this temperature is with antipyretics on board.  Patient reports that his body aches have improved but he still has a cough which is productive with yellow mucus.  He continues to have runny nose and allergy-like symptoms.  He has lots of congestion which is not allowing him to use his CPAP.  He does not use his CPAP throughout the entire course of this illness.  He has not been taking any sort of allergy medications.  Patient reports he is also been taking NyQuil so likely there is diphenhydramine in that.   MMRC - Breathlessness Score 3 - I stop for breath after walking about 100 yards or after a few minutes on level ground (isle at grocery store is 140ft)    Observations/Objective:  11/28/2018 - temperature - 99.6 (per patient)   Imaging CT chest 08/15/17-masslike consolidation in the right upper lobe with cavitation suspicious for necrotizing pneumonia.  Dilated pulmonary artery, accessory cardiac bronchus. CT chest 10/21/17-No pulmonary embolism, enlarged pulmonary artery.  Resolution of right upper lobe consolidation with residual scarring, emphysema. Chest x-ray 3/90/19- mild atelectasis in the right upper lobe, left base.  PFTs 10/31/17 FVC 4.08 [95%], FEV1 2.71 [78%], F/F 67, TLC 95%, DLCO 55% Moderate obstruction and diffusion impairment.  Echocardiogram 09/09/17  Normal LV systolic function; mild diastolic dysfunction; mildly   elevated velocity across pulmonic valve (2.5 m/s) suggests mild   PS; trace TR with mild pulmonary hypertension.  Sleep study  11/16/2017 Moderate OSA, AHI 17 with mild oxygen desaturation to 83%., optimal PAP pressure 9 cm.  CPAP  download 02/02/2018-02/28/2018 100% usage.  Average was seen in the office Set pressure 9 cm, residual AHI 2.7, 0 leaks  No results found for: NITRICOXIDE  Assessment and Plan:  COPD with chronic bronchitis and emphysema (HCC) Assessment: CT chest February/2019 showing emphysema mMRC 3 today Increased cough and sputum production Wheezing Not maintained on maintenance inhaler, did not tolerate Anoro Ellipta DPI Status post viral illness/influenza-like illness  Plan: Doxycycline Prednisone taper saba use as needed 1 week telephonic follow-up with [name], FNP Written out of work until 12/14/2018 Patient needs to obtain batteries to further evaluate fever   Tobacco abuse Plan:  Continue to not smoke  Suspected Covid-19 Virus Infection Assessment: Patient works with biomedical equipment There was initial thought in March/2020 that he has suspected COVID-19 exposure Intermittent fevers per patient Flulike illness treated with Tamiflu on 11/16/2018 Never swabbed for the flu  Plan: Low suspicion for COVID-19 Patient needs to obtain batteries and start monitoring his fevers twice a day and recording these.  We will review them next week at our telephonic outreach. 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  If patient is allowed to go back to work he will need to to wear a mask per CDC guidelines.    Follow Up Instructions:  Return in about 1 week (around 12/10/2018), or if symptoms worsen or fail to improve, for Follow up with [name] FNP-C.    I discussed the assessment and treatment plan with the patient. The patient was provided an opportunity to  ask questions and all were answered. The patient agreed with the plan and demonstrated an understanding of the instructions.   The patient was advised to call back or seek an in-person evaluation if the symptoms worsen or if the condition fails to improve as anticipated.  I provided 31 minutes of non-face-to-face time during this encounter.   [name], NP

## 2018-12-02 NOTE — Telephone Encounter (Signed)
Called the patient back and he confirmed that he was seen by urgent care on 11/16/18. Was treated for the flu. Patient stated he is feeling much better, but has continued to have a cough that is worse at night that has clear/yellow congestion.   Patient has been using tylenol cold and flu with nyquil to help with cough. Has not tried Mucinex. Patient denied having a fever, however he stated he has not been able to check his temperature because the thermometer he has needs a battery.  Concerned because of his OSA and where he works that he is at risk for COVID-19. Patient works at [employer] Face masks are not provided. 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  Recommended a televisit for the patient. Told him we are suggesting them to reduce patient risk, no face to face appointments if possible. Patient has been scheduled for a televisit 12/03/18 at 11:00.

## 2018-12-03 ENCOUNTER — Ambulatory Visit (INDEPENDENT_AMBULATORY_CARE_PROVIDER_SITE_OTHER): Payer: BLUE CROSS/BLUE SHIELD | Admitting: Pulmonary Disease

## 2018-12-03 ENCOUNTER — Encounter: Payer: Self-pay | Admitting: Pulmonary Disease

## 2018-12-03 ENCOUNTER — Other Ambulatory Visit: Payer: Self-pay

## 2018-12-03 ENCOUNTER — Encounter: Payer: Self-pay | Admitting: *Deleted

## 2018-12-03 DIAGNOSIS — Z720 Tobacco use: Secondary | ICD-10-CM | POA: Diagnosis not present

## 2018-12-03 DIAGNOSIS — R6889 Other general symptoms and signs: Secondary | ICD-10-CM

## 2018-12-03 DIAGNOSIS — Z20822 Contact with and (suspected) exposure to covid-19: Secondary | ICD-10-CM

## 2018-12-03 DIAGNOSIS — J449 Chronic obstructive pulmonary disease, unspecified: Secondary | ICD-10-CM | POA: Diagnosis not present

## 2018-12-03 DIAGNOSIS — J4489 Other specified chronic obstructive pulmonary disease: Secondary | ICD-10-CM

## 2018-12-03 MED ORDER — BENZONATATE 200 MG PO CAPS: 200.0000 mg | ORAL_CAPSULE | Freq: Three times a day (TID) | ORAL | 1 refills | Status: DC | PRN

## 2018-12-03 MED ORDER — DOXYCYCLINE HYCLATE 100 MG PO TABS: 100.0000 mg | ORAL_TABLET | Freq: Two times a day (BID) | ORAL | 0 refills | Status: DC

## 2018-12-03 MED ORDER — PREDNISONE 10 MG PO TABS: ORAL_TABLET | ORAL | 0 refills | Status: DC

## 2018-12-03 NOTE — Patient Instructions (Addendum)
Start Prednisone 10mg  tablet  >>>4 tabs for 2 days, then 3 tabs for 2 days, 2 tabs for 2 days, then 1 tab for 2 days, then stop >>>take with food  >>>take in the morning   Start Doxycycline >>> 1 100 mg tablet every 12 hours for 7 days >>>take with food  >>>wear sunscreen   Only use your albuterol as a rescue medication to be used if you can't catch your breath by resting or doing a relaxed purse lip breathing pattern.  - The less you use it, the better it will work when you need it. - Ok to use up to 2 puffs  every 4 hours if you must but call for immediate appointment if use goes up over your usual need - Don't leave home without it !!  (think of it like the spare tire for your car)    Please start taking a daily antihistamine:  >>>choose one of: zyrtec, claritin, allegra, or xyzal  >>>these are over the counter medications  >>>can choose generic option  >>>take daily  >>>this medication helps with allergies, post nasal drip, and cough     Purchase Batteries for your thermometer  >>>check temperature two times daily     Note your daily symptoms > remember "red flags" for COPD:   >>>Increase in cough >>>increase in sputum production >>>increase in shortness of breath or activity  intolerance.   If you notice these symptoms, please call the office to be seen.    Restart CPAP use when you can   We recommend that you continue using your CPAP daily >>>Keep up the hard work using your device >>> Goal should be wearing this for the entire night that you are sleeping, at least 4 to 6 hours  Remember:  . Do not drive or operate heavy machinery if tired or drowsy.  . Please notify the supply company and office if you are unable to use your device regularly due to missing supplies or machine being broken.  . Work on maintaining a healthy weight and following your recommended nutrition plan  . Maintain proper daily exercise and movement  . Maintaining proper use of your device  can also help improve management of other chronic illnesses such as: Blood pressure, blood sugars, and weight management.   BiPAP/ CPAP Cleaning:  >>>Clean weekly, with Dawn soap, and bottle brush.  Set up to air dry.    We have written a work note for you to remain out of work until 12/14/2018.  We will follow-up with you next week and we can further discuss your symptoms as well as get documented temperatures from you.  In order for you to return back to work you need to be able to wear a mask as well as have an employee that is following CDC and OSHA guidelines.   If symptoms worsen or shortness of breath increases or fevers are persistently elevated above 100 F.  Please present to an emergency room.  Wear a mask.  Notify the emergency room doctor that you may have a COVID-19 exposure.   Return in about 1 week (around 12/10/2018), or if symptoms worsen or fail to improve, for Follow up with [name] FNP-C.     Coronavirus (COVID-19) Are you at risk?  Are you at risk for the Coronavirus (COVID-19)?  To be considered HIGH RISK for Coronavirus (COVID-19), you have to meet the following criteria:  . Traveled to [place], [place], [place], [place] or [place]; or in the  [place] States to [place], [place], [place], or [place]; and have fever, cough, and shortness of breath within the last 2 weeks of travel OR . Been in close contact with a person diagnosed with COVID-19 within the last 2 weeks and have fever, cough, and shortness of breath . IF YOU DO NOT MEET THESE CRITERIA, YOU ARE CONSIDERED LOW RISK FOR COVID-19.  What to do if you are HIGH RISK for COVID-19?  Marland Kitchen If you are having a medical emergency, call 911. . Seek medical Mal[facility] right away. Before you go to a doctor's office, urgent [facility] or emergency department, call ahead and tell them about your recent travel, contact with someone diagnosed with COVID-19, and your symptoms. You should receive instructions from your  physician's office regarding next steps of [facility].  . When you arrive at healthcare provider, tell the healthcare staff immediately you have returned from visiting [place], [place], [place], [place] or [place]; or traveled in the [place] to [place], [place], [place], or [place]; in the last two weeks or you have been in close contact with a person diagnosed with COVID-19 in the last 2 weeks.   . Tell the health [facility] staff about your symptoms: fever, cough and shortness of breath. . After you have been seen by a medical provider, you will be either: o Tested for (COVID-19) and discharged home on quarantine except to seek medical Maldonado if symptoms worsen, and asked to  - Stay home and avoid contact with others until you get your results (4-5 days)  - Avoid travel on public transportation if possible (such as bus, train, or airplane) or o Sent to the Emergency Department by EMS for evaluation, COVID-19 testing, and possible admission depending on your condition and test results.  What to do if you are LOW RISK for COVID-19?  Reduce your risk of any infection by using the same precautions used for avoiding the common cold or flu:  Marland Kitchen Wash your hands often with soap and warm water for at least 20 seconds.  If soap and water are not readily available, use an alcohol-based hand sanitizer with at least 60% alcohol.  . If coughing or sneezing, cover your mouth and nose by coughing or sneezing into the elbow areas of your shirt or coat, into a tissue or into your sleeve (not your hands). . Avoid shaking hands with others and consider head nods or verbal greetings only. . Avoid touching your eyes, nose, or mouth with unwashed hands.  . Avoid close contact with people who are sick. . Avoid places or events with large numbers of people in one location, like concerts or sporting events. . Carefully consider travel plans you have or are making. . If you are planning any travel outside or inside the [place],  visit the CDC's Travelers' Health webpage for the latest health notices. . If you have some symptoms but not all symptoms, continue to monitor at home and seek medical attention if your symptoms worsen. . If you are having a medical emergency, call 911.   Guys / e-Visit: [email or web address]         [facility]: [phone]  [facility]: [phone]                   [facility] [place]: [phone]           It is flu season:   >>> Best  ways to protect herself from the flu: Receive the yearly flu vaccine, practice good hand hygiene washing with soap and also using hand sanitizer when available, eat a nutritious meals, get adequate rest, hydrate appropriately   Please contact the office if your symptoms worsen or you have concerns that you are not improving.   Thank you for choosing [facility] for your healthcare, and for allowing [place] to partner with you on your healthcare journey. I am thankful to be able to provide Mal[place] to you today.   [name] FNP-C    Chronic Obstructive Pulmonary Disease Exacerbation Chronic obstructive pulmonary disease (COPD) is a long-term (chronic) lung problem. In COPD, the flow of air from the lungs is limited. COPD exacerbations are times that breathing gets worse and you need more than your normal treatment. Without treatment, they can be life threatening. If they happen often, your lungs can become more damaged. If your COPD gets worse, your doctor may treat you with:  Medicines.  Oxygen.  Different ways to clear your airway, such as using a mask. Follow these instructions at home: Medicines  Take over-the-counter and prescription medicines only as told by your doctor.  If you take an antibiotic or steroid medicine, do not stop taking the medicine even if you start to feel better.  Keep up  with shots (vaccinations) as told by your doctor. Be sure to get a yearly (annual) flu shot. Lifestyle  Do not smoke. If you need help quitting, ask your doctor.  Eat healthy foods.  Exercise regularly.  Get plenty of sleep.  Avoid tobacco smoke and other things that can bother your lungs.  Wash your hands often with soap and water. This will help keep you from getting an infection. If you cannot use soap and water, use hand sanitizer.  During flu season, avoid areas that are crowded with people. General instructions  Drink enough fluid to keep your pee (urine) clear or pale yellow. Do not do this if your doctor has told you not to.  Use a cool mist machine (vaporizer).  If you use oxygen or a machine that turns medicine into a mist (nebulizer), continue to use it as told.  Follow all instructions for rehabilitation. These are steps you can take to make your body work better.  Keep all follow-up visits as told by your doctor. This is important. Contact a doctor if:  Your COPD symptoms get worse than normal. Get help right away if:  You are short of breath and it gets worse.  You have trouble talking.  You have chest pain.  You cough up blood.  You have a fever.  You keep throwing up (vomiting).  You feel weak or you pass out (faint).  You feel confused.  You are not able to sleep because of your symptoms.  You are not able to do daily activities. Summary  COPD exacerbations are times that breathing gets worse and you need more treatment than normal.  COPD exacerbations can be very serious and may cause your lungs to become more damaged.  Do not smoke. If you need help quitting, ask your doctor.  Stay up-to-date on your shots. Get a flu shot every year. This information is not intended to replace advice given to you by your health [facility] provider. Make sure you discuss any questions you have with your health [facility] provider. Document Released: 08/01/2011  Document Revised: 09/16/2016 Document Reviewed: 09/16/2016 Elsevier Interactive Patient Education  2019 [employer].

## 2018-12-03 NOTE — Assessment & Plan Note (Signed)
Plan: Continue to not smoke 

## 2018-12-03 NOTE — Assessment & Plan Note (Addendum)
Assessment: Patient works with [employer] There was initial thought in March/2020 that he has suspected COVID-19 exposure Intermittent fevers per patient Flulike illness treated with Tamiflu on 11/16/2018 Never swabbed for the flu  Plan: Low suspicion for COVID-19 Patient needs to obtain batteries and start monitoring his fevers twice a day and recording these.  We will review them next week at our telephonic outreach. 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  If patient is allowed to go back to work he will need to to wear a mask per CDC guidelines.

## 2018-12-03 NOTE — Assessment & Plan Note (Signed)
Assessment: CT chest February/2019 showing emphysema mMRC 3 today Increased cough and sputum production Wheezing Not maintained on maintenance inhaler, did not tolerate Anoro Ellipta DPI Status post viral illness/influenza-like illness  Plan: Doxycycline Prednisone taper saba use as needed 1 week telephonic follow-up with [name], FNP Written out of work until 12/14/2018 Patient needs to obtain batteries to further evaluate fever

## 2018-12-09 NOTE — Progress Notes (Signed)
Virtual Visit via Telephone Note  I connected with Alex Maldonado on 12/10/18 at  2:30 PM EDT by telephone and verified that I am speaking with the correct person using two identifiers.   I discussed the limitations, risks, security and privacy concerns of performing an evaluation and management service by telephone and the availability of in person appointments. I also discussed with the patient that there may be a patient responsible charge related to this service. The patient expressed understanding and agreed to proceed.   History of Present Illness: 48 year old former smoker followed in our office for emphysema.  Patient also was treated in December/2018 for a necrotizing pneumonia in his right upper lobe, patient was treated with Rocephin, Zithromax and vancomycin and discharged on Levaquin.  HIV test was nonreactive. Past medical history: Heart murmur Pt of Dr. Vaughan Maldonado  Patient consented to consult via telephone: Yes People present and their role in pt care: Pt   Chief complaint: Emphysema   48 year old male former smoker (quit 2018) followed in our office for emphysema.  Patient completing a 1 week telephonic outrage follow-up to check on patient as he was having a probable COPD exacerbation last week.  Patient reports that the doxycycline and prednisone have significantly helped his breathing, his cough is improved, sputum amount and color has improved, patient reporting it is gone from a thick yellow to a thick white sputum color.  Patient also attributes some of his success in symptomatic improvement to the fact that he is resumed using his CPAP as he is got new CPAP supplies since the mask he had cracked  Patient was having intermittent fevers at last telephonic outrage 1 week ago.  Patient reports that fevers have been better since he has been checking twice daily.  He reports his T-max was 99 6 on 12/10/2018.  Patient reports that he is been afebrile for the last 2 days.  Patient  has not been using antipyretics.  Patient is started taking a daily antihistamine, Zyrtec.  Patient reports he still is having clear nasal drainage and his nose is extremely stuffy.  MMRC - Breathlessness Score 2 - on level ground, I walk slower than people of the same age because of breathlessness, or have to stop for breathe when walking to my own pace    Observations/Objective:  12/08/2018 - temp - 99.6 12/10/2018- temperature - 98.6  Imaging CT chest 08/15/17-masslike consolidation in the right upper lobe with cavitation suspicious for necrotizing pneumonia.  Dilated pulmonary artery, accessory cardiac bronchus. CT chest 10/21/17-No pulmonary embolism, enlarged pulmonary artery.  Resolution of right upper lobe consolidation with residual scarring, emphysema. Chest x-ray 3/90/19- mild atelectasis in the right upper lobe, left base.  PFTs 10/31/17 FVC 4.08 [95%], FEV1 2.71 [78%], F/F 67, TLC 95%, DLCO 55% Moderate obstruction and diffusion impairment.   Echocardiogram 09/09/17  Normal LV systolic function; mild diastolic dysfunction; mildly  elevated velocity across pulmonic valve (2.5 m/s) suggests mild  PS; trace TR with mild pulmonary hypertension.  Sleep study  11/16/2017 Moderate OSA, AHI 17 with mild oxygen desaturation to 83%., optimal PAP pressure 9 cm.  CPAP download 02/02/2018-02/28/2018 100% usage.  Average was seen in the office Set pressure 9 cm, residual AHI 2.7, 0 leaks  No results found for: NITRICOXIDE  Assessment and Plan:  COPD with chronic bronchitis and emphysema (HCC) Assessment: CT chest February/2019 showing emphysema mMRC 2 today Improved cough, sputum production, wheezing, shortness of breath Patient unable to tolerate maintenance inhalers, did not tolerate Anoro  Ellipta DPI  Plan: Laurence Ferrari use as needed Can get back to work on 12/14/2018 as long as patient has had 3 days without fevers without any antipyretics Patient needs to wear the appropriate  protective gear when at work Patient is to wear a mask whenever he is out in public Emphasized the importance of patient doing appropriate handwashing 15-month follow-up with [name], FNP   Allergic rhinitis Assessment: Patient reporting persistent clear nasal drainage Patient is taking Zyrtec daily  Plan: Continue Zyrtec daily Start Flonase 1 spray each nostril daily for the next 7 days then can use as needed for nasal congestion and allergy symptoms  Suspected Covid-19 Virus Infection Assessment: Improving intermittent fevers  Plan: Can return to work on 12/14/2018 as long as patient has been afebrile for the last 3 days without any antipyretics Patient needs wear appropriate work PPE Patient is to wear a mask out in public Patient needs to practice good hand hygiene    Tobacco abuse Plan: Continue to not smoke  Follow Up Instructions:  Return in about 2 months (around 02/09/2019), or if symptoms worsen or fail to improve, for Follow up with [name] FNP-C.    I discussed the assessment and treatment plan with the patient. The patient was provided an opportunity to ask questions and all were answered. The patient agreed with the plan and demonstrated an understanding of the instructions.   The patient was advised to call back or seek an in-person evaluation if the symptoms worsen or if the condition fails to improve as anticipated.  I provided 25 minutes of non-face-to-face time during this encounter.   [name], NP

## 2018-12-10 ENCOUNTER — Ambulatory Visit (INDEPENDENT_AMBULATORY_CARE_PROVIDER_SITE_OTHER): Payer: BLUE CROSS/BLUE SHIELD | Admitting: Pulmonary Disease

## 2018-12-10 ENCOUNTER — Encounter: Payer: Self-pay | Admitting: Pulmonary Disease

## 2018-12-10 ENCOUNTER — Other Ambulatory Visit: Payer: Self-pay

## 2018-12-10 DIAGNOSIS — Z720 Tobacco use: Secondary | ICD-10-CM

## 2018-12-10 DIAGNOSIS — J309 Allergic rhinitis, unspecified: Secondary | ICD-10-CM | POA: Diagnosis not present

## 2018-12-10 DIAGNOSIS — J449 Chronic obstructive pulmonary disease, unspecified: Secondary | ICD-10-CM | POA: Diagnosis not present

## 2018-12-10 DIAGNOSIS — R6889 Other general symptoms and signs: Secondary | ICD-10-CM | POA: Diagnosis not present

## 2018-12-10 DIAGNOSIS — Z20822 Contact with and (suspected) exposure to covid-19: Secondary | ICD-10-CM

## 2018-12-10 MED ORDER — FLUTICASONE PROPIONATE 50 MCG/ACT NA SUSP: 1.0000 | Freq: Every day | NASAL | 3 refills | Status: DC

## 2018-12-10 NOTE — Assessment & Plan Note (Signed)
Assessment: Patient reporting persistent clear nasal drainage Patient is taking Zyrtec daily  Plan: Continue Zyrtec daily Start Flonase 1 spray each nostril daily for the next 7 days then can use as needed for nasal congestion and allergy symptoms

## 2018-12-10 NOTE — Assessment & Plan Note (Signed)
Plan: Continue to not smoke 

## 2018-12-10 NOTE — Assessment & Plan Note (Addendum)
Assessment: CT chest February/2019 showing emphysema mMRC 2 today Improved cough, sputum production, wheezing, shortness of breath Patient unable to tolerate maintenance inhalers, did not tolerate Anoro Ellipta DPI  Plan: Laurence Ferrari use as needed Can get back to work on 12/14/2018 as long as patient has had 3 days without fevers without any antipyretics Patient needs to wear the appropriate protective gear when at work Patient is to wear a mask whenever he is out in public Emphasized the importance of patient doing appropriate handwashing 18-month follow-up with [name], FNP

## 2018-12-10 NOTE — Patient Instructions (Addendum)
Can return to work on 12/14/18 >>> wear appropriate work PPE such as face shields, gloves, etc. >>> Recommend wearing a mask whenever you are out in [place] often to reduce your risk of contracting bacterial and viral illnesses such as the novel coronavirus  Only use your albuterol as a rescue medication to be used if you can't catch your breath by resting or doing a relaxed purse lip breathing pattern.  - The less you use it, the better it will work when you need it. - Ok to use up to 2 puffs  every 4 hours if you must but call for immediate appointment if use goes up over your usual need - Don't leave home without it !!  (think of it like the spare tire for your car)   Continue daily zyrtec daily   Start flonase / fluticasone nasal spray daily - 1 spray each nostril daily for 7 days then as needed for allergies    We recommend that you continue using your CPAP daily >>>Keep up the hard work using your device >>> Goal should be wearing this for the entire night that you are sleeping, at least 4 to 6 hours  Remember:  . Do not drive or operate heavy machinery if tired or drowsy.  . Please notify the supply company and office if you are unable to use your device regularly due to missing supplies or machine being broken.  . Work on maintaining a healthy weight and following your recommended nutrition plan  . Maintain proper daily exercise and movement  . Maintaining proper use of your device can also help improve management of other chronic illnesses such as: Blood pressure, blood sugars, and weight management.   BiPAP/ CPAP Cleaning:  >>>Clean weekly, with Dawn soap, and bottle brush.  Set up to air dry.    Return in about 2 months (around 02/09/2019), or if symptoms worsen or fail to improve, for Follow up with [name] FNP-C.    Coronavirus (COVID-19) Are you at risk?  Are you at risk for the Coronavirus (COVID-19)?  To be considered HIGH RISK for Coronavirus  (COVID-19), you have to meet the following criteria:  . Traveled to [place], [place], [place], [place] or [place]; or in the [place] to [place], [place], [place], or [place]; and have fever, cough, and shortness of breath within the last 2 weeks of travel OR . Been in close contact with a person diagnosed with COVID-19 within the last 2 weeks and have fever, cough, and shortness of breath . IF YOU DO NOT MEET THESE CRITERIA, YOU ARE CONSIDERED LOW RISK FOR COVID-19.  What to do if you are HIGH RISK for COVID-19?  Marland Kitchen If you are having a medical emergency, call 911. . Seek medical care right away. Before you go to a doctor's office, urgent care or emergency department, call ahead and tell them about your recent travel, contact with someone diagnosed with COVID-19, and your symptoms. You should receive instructions from your physician's office regarding next steps of care.  . When you arrive at healthcare provider, tell the healthcare staff immediately you have returned from visiting [place], [place], [place], [place] or [place]; or traveled in the [place] to [place], [place], [place], or [place]; in the last two weeks or you have been in close contact with a person diagnosed with COVID-19 in the last 2 weeks.   . Tell the health care staff about your symptoms:  fever, cough and shortness of breath. . After you have been seen by a medical provider, you will be either: o Tested for (COVID-19) and discharged home on quarantine except to seek medical care if symptoms worsen, and asked to  - Stay home and avoid contact with others until you get your results (4-5 days)  - Avoid travel on public transportation if possible (such as bus, train, or airplane) or o Sent to the Emergency Department by EMS for evaluation, COVID-19 testing, and possible admission depending on your condition and test results.  What to do if you are LOW RISK for COVID-19?  Reduce your risk of any infection by  using the same precautions used for avoiding the common cold or flu:  Marland Kitchen Wash your hands often with soap and warm water for at least 20 seconds.  If soap and water are not readily available, use an alcohol-based hand sanitizer with at least 60% alcohol.  . If coughing or sneezing, cover your mouth and nose by coughing or sneezing into the elbow areas of your shirt or coat, into a tissue or into your sleeve (not your hands). . Avoid shaking hands with others and consider head nods or verbal greetings only. . Avoid touching your eyes, nose, or mouth with unwashed hands.  . Avoid close contact with people who are sick. . Avoid places or events with large numbers of people in one location, like concerts or sporting events. . Carefully consider travel plans you have or are making. . If you are planning any travel outside or inside the [place], visit the CDC's Travelers' Health webpage for the latest health notices. . If you have some symptoms but not all symptoms, continue to monitor at home and seek medical attention if your symptoms worsen. . If you are having a medical emergency, call 911.   [place] / e-Visit: [email or web address]         [facility]: [facility]: [phone]                   [facility]: [phone]           It is flu season:   >>> Best ways to protect herself from the flu: Receive the yearly flu vaccine, practice good hand hygiene washing with soap and also using hand sanitizer when available, eat a nutritious meals, get adequate rest, hydrate appropriately   Please contact the office if your symptoms worsen or you have concerns that you are not improving.   Thank you for choosing [facility] for your healthcare, and for allowing [place] to partner with you on your healthcare journey. I am thankful to be able  to provide care to you today.   [name] FNP-C

## 2018-12-10 NOTE — Assessment & Plan Note (Signed)
Assessment: Improving intermittent fevers  Plan: Can return to work on 12/14/2018 as long as patient has been afebrile for the last 3 days without any antipyretics Patient needs wear appropriate work PPE Patient is to wear a mask out in public Patient needs to practice good hand hygiene

## 2018-12-14 ENCOUNTER — Telehealth: Payer: Self-pay | Admitting: Pulmonary Disease

## 2018-12-14 NOTE — Telephone Encounter (Signed)
As I explained to the patient as well as as I documented in the last office note when I spoke to the patient last week.  Patient can return back to work when he has been afebrile for 3 consecutive days without antipyretics.  Based off the information that was just provided then the patient can return to work on 12/15/2018 if he remains afebrile.  [name], FNP

## 2018-12-14 NOTE — Telephone Encounter (Signed)
Primary Pulmonologist: [place] Last office visit and with whom: 12/10/2018 with [name] What do we see them for (pulmonary problems): COPD with chronic bronchitis and emphysema Last OV assessment/plan: Instructions  Return in about 2 months (around 02/09/2019), or if symptoms worsen or fail to improve, for Follow up with [name] FNP-C.  Can return to work on 12/14/18 >>> wear appropriate work PPE such as face shields, gloves, etc. >>> Recommend wearing a mask whenever you are out in [place] often to reduce your risk of contracting bacterial and viral illnesses such as the novel coronavirus  Only use your albuterol as a rescue medication to be used if you can't catch your breath by resting or doing a relaxed purse lip breathing pattern.  - The less you use it, the better it will work when you need it. - Ok to use up to 2 puffs every 4 hours if you must but call for immediate appointment if use goes up over your usual need - Don't leave home without it !! (think of it like the spare tire for your car)  Continue daily zyrtec daily   Start flonase / fluticasone nasal spray daily - 1 spray each nostril daily for 7 days then as needed for allergies   We recommend that you continue using your CPAP daily >>>Keep up the hard work using your device >>> Goal should be wearing this for the entire night that you are sleeping, at least 4 to 6 hours  Remember:   Do not drive or operate heavy machinery if tired or drowsy.   Please notify the supply company and office if you are unable to use your device regularly due to missing supplies or machine being broken.   Work on maintaining a healthy weight and following your recommended nutrition plan   Maintain proper daily exercise and movement   Maintaining proper use of your device can also help improve management of other chronic illnesses such as: Blood pressure, blood sugars, and weight management.   BiPAP/ CPAP Cleaning:   >>>Clean weekly, with Dawn soap, and bottle brush.  Set up to air dry.  Return in about 2 months (around 02/09/2019), or if symptoms worsen or fail to improve, for Follow up with [name] FNP-C.       Was appointment offered to patient (explain)?  No, pt wants to know if he can return back to work   Reason for call: Called and spoke with pt in regards to wanting to know if he is able to return back to work. Asked pt if he has been keeping a log of his temperatures and pt stated he has.  -4/16, pt's temp was 98.6 -4/17, temp was 98.7 in the morning and then later on that evening he took temp was 99 -4/18, pt's temp was 100.1 at 7:07am (no tylenol was taken) and then later on that evening temp was 98.9 -4/19, pt's temp was 98.5 in the morning and then was 98.9 that evening -today, 4/20 pt's temp was 98.9 this morning   Pt has had some hot/cold sweats that he states happens at anytime but is worse in the middle of the night.  Pt states that he still has a mild cough and states in the mornings he is getting mucus up which is  more white in color but does have a tinge of yellow in it.  Pt has been doing the flonase to help with nose and states the flonase has helped but  states that he is still having somewhat of a runny nose which he believes is due to the pollen  Pt did take doxy and prednisone that was prescribed and states last pill was 4/18 for both prednisone and doxy. Both doxy and prednisone were prescribed 4/9. Pt states he is still taking the benzonatate which he states has helped his cough.  Pt called and spoke with supervisor in regards to PPE for his safety at work and per pt, work is unable to provide him a mask for work but pt stated he will have a face shield if needed, goggles, and gloves  Asked pt if he has taken tylenol any since last televisit 4/16 with Aaron Edelman and pt stated he did take tylenol 4/19 due to having a mild headache  Aaron Edelman, please advise on this for pt if  you believe that pt will be okay to return back to work. Thanks!

## 2018-12-14 NOTE — Telephone Encounter (Signed)
Called and spoke with pt stating the info from [place]. Pt expressed understanding. Nothing further needed.

## 2018-12-15 ENCOUNTER — Telehealth: Payer: Self-pay | Admitting: Pulmonary Disease

## 2018-12-15 NOTE — Telephone Encounter (Signed)
Returned call to patient. Provided number to [place]. Patient is requesting copy of office visit and letter written for disablity claim. Nothing further needed.

## 2018-12-16 ENCOUNTER — Encounter: Payer: Self-pay | Admitting: *Deleted

## 2018-12-16 ENCOUNTER — Telehealth: Payer: Self-pay | Admitting: Pulmonary Disease

## 2018-12-16 NOTE — Telephone Encounter (Signed)
Called and spoke with Patient.  Patient is needing a letter faxed to his company's claims department, stating [name], NP released him from Covid protocol and he is able to return to work, 12/15/18.  Fax number is [phone], attn Gaspar Bidding. Per [name], NP, 12/10/18-  Can return to work on 12/14/18 >>> wear appropriate work PPE such as face shields, gloves, etc. >>> Recommend wearing a mask whenever you are out in [place] often to reduce your risk of contracting bacterial and viral illnesses such as the novel coronavirus  Letter printed and faxed to requested number.  Nothing further at this time.

## 2019-01-04 ENCOUNTER — Telehealth: Payer: Self-pay | Admitting: Pulmonary Disease

## 2019-01-04 NOTE — Telephone Encounter (Signed)
Called and spoke with pt who stated he had forms sent to his phone from HR to have filled out due to him being out of work due to [employer]. I stated to pt that we could not do email but he could fax the forms to [place] at [phone] and pt verbalized understanding. Nothing further needed.

## 2019-01-14 ENCOUNTER — Telehealth: Payer: Self-pay | Admitting: Pulmonary Disease

## 2019-01-14 NOTE — Telephone Encounter (Signed)
Aaron Edelman has signed paperwork and folder returned to [employer]

## 2019-01-19 ENCOUNTER — Telehealth: Payer: Self-pay | Admitting: Pulmonary Disease

## 2019-01-19 NOTE — Telephone Encounter (Signed)
Left message for patient to call back. Per note from 5/21, we received forms from [employer] and these were filled out and given back to [facility] to send back to [employer].

## 2019-01-20 NOTE — Telephone Encounter (Signed)
Attempted to call x 3 re: [place] paperwork.  Left message on voicemail to call back if further questions on paperwork.  Forms sent back to [employer] per notes.  Will close encounter.

## 2019-01-27 NOTE — Telephone Encounter (Signed)
Just to confirm, Alex Maldonado, have you received this paperwork? Thank you.

## 2019-01-27 NOTE — Telephone Encounter (Signed)
If it was returned on 01/14/2019 to me, I would have fwd it on to [place] the same day. I must not have documented it. Please contact [employer] to see if they received the paperwork, you can also look in the Media tab to see if it was scanned it. Sorry about this -pr

## 2019-01-27 NOTE — Telephone Encounter (Signed)
I checked pt's chart under the Media tab and verified that pt's FMLA paperwork was scanned in his chart on 01/15/2019.   I also called [employer] and spoke with Vern to verify they received this paperwork. Vern verified [employer] received paperwork via incoming fax on 01/15/2019.  FMLA paperwork has been taken care of. Will close encounter. Nothing further needed at this time.

## 2019-02-09 ENCOUNTER — Ambulatory Visit: Payer: BLUE CROSS/BLUE SHIELD | Admitting: Pulmonary Disease

## 2019-07-11 ENCOUNTER — Emergency Department (HOSPITAL_COMMUNITY)
Admission: EM | Admit: 2019-07-11 | Discharge: 2019-07-12 | Disposition: A | Discharge status: Home / Self Care | Payer: BC Managed Care – PPO | Attending: Emergency Medicine | Admitting: Emergency Medicine

## 2019-07-11 ENCOUNTER — Encounter (HOSPITAL_COMMUNITY): Payer: Self-pay

## 2019-07-11 ENCOUNTER — Other Ambulatory Visit: Payer: Self-pay

## 2019-07-11 ENCOUNTER — Emergency Department (HOSPITAL_COMMUNITY): Payer: BC Managed Care – PPO

## 2019-07-11 DIAGNOSIS — I10 Essential (primary) hypertension: Secondary | ICD-10-CM | POA: Insufficient documentation

## 2019-07-11 DIAGNOSIS — R7989 Other specified abnormal findings of blood chemistry: Secondary | ICD-10-CM

## 2019-07-11 DIAGNOSIS — Z87891 Personal history of nicotine dependence: Secondary | ICD-10-CM | POA: Insufficient documentation

## 2019-07-11 DIAGNOSIS — J449 Chronic obstructive pulmonary disease, unspecified: Secondary | ICD-10-CM | POA: Insufficient documentation

## 2019-07-11 DIAGNOSIS — R1011 Right upper quadrant pain: Secondary | ICD-10-CM | POA: Insufficient documentation

## 2019-07-11 DIAGNOSIS — R945 Abnormal results of liver function studies: Secondary | ICD-10-CM | POA: Insufficient documentation

## 2019-07-11 LAB — URINALYSIS, ROUTINE W REFLEX MICROSCOPIC
Bilirubin Urine: NEGATIVE
Glucose, UA: NEGATIVE mg/dL
Hgb urine dipstick: NEGATIVE
Ketones, ur: NEGATIVE mg/dL
Leukocytes,Ua: NEGATIVE
Nitrite: NEGATIVE
Protein, ur: NEGATIVE mg/dL
Specific Gravity, Urine: 1.014 (ref 1.005–1.030)
pH: 5 (ref 5.0–8.0)

## 2019-07-11 LAB — COMPREHENSIVE METABOLIC PANEL
ALT: 83 U/L — ABNORMAL HIGH (ref 0–44)
AST: 119 U/L — ABNORMAL HIGH (ref 15–41)
Albumin: 4.2 g/dL (ref 3.5–5.0)
Alkaline Phosphatase: 92 U/L (ref 38–126)
Anion gap: 13 (ref 5–15)
BUN: 6 mg/dL (ref 6–20)
CO2: 24 mmol/L (ref 22–32)
Calcium: 9.4 mg/dL (ref 8.9–10.3)
Chloride: 99 mmol/L (ref 98–111)
Creatinine, Ser: 0.95 mg/dL (ref 0.61–1.24)
GFR calc Af Amer: >60 mL/min (ref >60)
GFR calc non Af Amer: >60 mL/min (ref >60)
Glucose, Bld: 104 mg/dL — ABNORMAL HIGH (ref 70–99)
Potassium: 3.6 mmol/L (ref 3.5–5.1)
Sodium: 136 mmol/L (ref 135–145)
Total Bilirubin: 0.3 mg/dL (ref 0.3–1.2)
Total Protein: 7.8 g/dL (ref 6.5–8.1)

## 2019-07-11 LAB — CBC
HCT: 48.9 % (ref 39.0–52.0)
Hemoglobin: 16.2 g/dL (ref 13.0–17.0)
MCH: 29.5 pg (ref 26.0–34.0)
MCHC: 33.1 g/dL (ref 30.0–36.0)
MCV: 89.1 fL (ref 80.0–100.0)
Platelets: 214 10*3/uL (ref 150–400)
RBC: 5.49 MIL/uL (ref 4.22–5.81)
RDW: 12.7 % (ref 11.5–15.5)
WBC: 6.8 10*3/uL (ref 4.0–10.5)
nRBC: 0 % (ref 0.0–0.2)

## 2019-07-11 LAB — LIPASE, BLOOD: Lipase: 20 U/L (ref 11–51)

## 2019-07-11 MED ORDER — IBUPROFEN 800 MG PO TABS: 800.0000 mg | ORAL_TABLET | Freq: Three times a day (TID) | ORAL | 0 refills | Status: DC

## 2019-07-11 MED ORDER — ONDANSETRON 4 MG PO TBDP: 4.0000 mg | ORAL_TABLET | Freq: Three times a day (TID) | ORAL | 0 refills | Status: DC | PRN

## 2019-07-11 MED ORDER — OXYCODONE HCL 5 MG PO TABS
5.0000 mg | ORAL_TABLET | Freq: Once | ORAL | Status: AC
  Administered 2019-07-11: 5 mg via ORAL
  Filled 2019-07-11: qty 1

## 2019-07-11 MED ORDER — ONDANSETRON HCL 4 MG/2ML IJ SOLN
4.0000 mg | Freq: Once | INTRAMUSCULAR | Status: AC
  Administered 2019-07-11: 23:00:00 4 mg via INTRAVENOUS
  Filled 2019-07-11: qty 2

## 2019-07-11 MED ORDER — SODIUM CHLORIDE 0.9% FLUSH
3.0000 mL | Freq: Once | INTRAVENOUS | Status: AC
  Administered 2019-07-12: 3 mL via INTRAVENOUS

## 2019-07-11 MED ORDER — MORPHINE SULFATE (PF) 4 MG/ML IV SOLN
4.0000 mg | Freq: Once | INTRAVENOUS | Status: AC
  Administered 2019-07-11: 4 mg via INTRAVENOUS
  Filled 2019-07-11: qty 1

## 2019-07-11 NOTE — Discharge Instructions (Addendum)
Ultrasound looked ok today, normal gallbladder but you did have some findings of fatty liver and your liver tests were elevated.  I have attached some information about this. For the next few weeks I would try to avoid drinking any type of alcohol or taking Tylenol as this does process to the liver.  Motrin, Aleve, ibuprofen, etc. are okay, these process to the kidney-- have written prescription. You will need close follow-up with your primary care doctor for recheck of your LFTs. Please return here for any new or acute changes.

## 2019-07-11 NOTE — ED Provider Notes (Signed)
[facility] DEPT Provider Note   CSN: [id number] Arrival date & time: 07/11/19  2116     History   Chief Complaint Chief Complaint  Patient presents with  . Abdominal Pain    HPI Alex Maldonado is a 48 y.o. male.     The history is provided by the patient and medical records.  Abdominal Pain    48 y.o. M with hx of COPD, heart murmur, HTN, presenting to the ED for abdominal pain.  Patient reports his birthday was [birth year], but he was in [place], [place] helping his fiance with a job.  States he ate at a [employer] there, then ate a cheeseburger when returning home to [place] and since that time he has had ongoing RUQ pain.  States it does waxe and wane in severity, does seem to get worse after eating.  States he always feels extremely bloated and full after he eats, even if in small amounts.  No vomiting but has had some nausea.  He had 2 BM's today, first was formed, second was slightly loose and runny.  He denies any prior abdominal surgeries.  He has tried taking aleve and using topical icy hot without relief.  Past Medical History:  Diagnosis Date  . COPD (chronic obstructive pulmonary disease) ([place])   . Heart murmur   . Hypertension     Patient Active Problem List   Diagnosis Date Noted  . Allergic rhinitis 12/10/2018  . Suspected COVID-19 virus infection 12/03/2018  . COPD with chronic bronchitis and emphysema ([place]) 11/05/2017  . Tobacco abuse   . Right upper lobe pneumonia 08/15/2017  . Necrotizing pneumonia ([place]) 08/15/2017  . Sepsis ([place]) 08/15/2017  . Heart murmur 08/15/2017  . Malnutrition of moderate degree 08/15/2017    History reviewed. No pertinent surgical history.      Home Medications    Prior to Admission medications   Medication Sig Start Date End Date Taking? Authorizing Provider  albuterol (PROVENTIL HFA;VENTOLIN HFA) 108 (90 Base) MCG/ACT inhaler Inhale 2 puffs into the lungs every 6 (six) hours as needed  for wheezing or shortness of breath. 08/19/17   Allie Bossier, MD  benzonatate (TESSALON) 200 MG capsule Take 1 capsule (200 mg total) by mouth 3 (three) times daily as needed for cough. 12/03/18   [name], NP  doxycycline (VIBRA-TABS) 100 MG tablet Take 1 tablet (100 mg total) by mouth 2 (two) times daily. 12/03/18   [name], NP  fluticasone (FLONASE) 50 MCG/ACT nasal spray Place 1 spray into both nostrils daily. 12/10/18   [name], NP  Multiple Vitamin (MULTIVITAMIN WITH MINERALS) TABS tablet Take 1 tablet by mouth daily.    [provider]  oseltamivir (TAMIFLU) 75 MG capsule Take 1 capsule (75 mg total) by mouth every 12 (twelve) hours. 11/16/18   Rodriguez-Southworth, Sunday Spillers, PA-C  predniSONE (DELTASONE) 10 MG tablet 4 tabs for 2 days, then 3 tabs for 2 days, 2 tabs for 2 days, then 1 tab for 2 days, then stop 12/03/18   [name], NP    Family History No family history on file.  Social History Social History   Tobacco Use  . Smoking status: Former Smoker    Types: Cigarettes  . Smokeless tobacco: Never Used  . Tobacco comment: quit 07/14/17  Substance Use Topics  . Alcohol use: Yes    Comment: beer  . Drug use: Yes    Types: Marijuana     Allergies  Patient has no known allergies.   Review of Systems Review of Systems  Gastrointestinal: Positive for abdominal pain.  All other systems reviewed and are negative.    Physical Exam Updated Vital Signs BP (!) 145/98 (BP Location: Left Arm)   Pulse 79   Temp 97.7 F (36.5 C) (Oral)   Resp 16   Ht 6\' 1"  (1.854 m)   Wt 79.4 kg   SpO2 100%   BMI 23.09 kg/m   Physical Exam Vitals signs and nursing note reviewed.  Constitutional:      Appearance: He is well-developed.  HENT:     Head: Normocephalic and atraumatic.  Eyes:     Conjunctiva/sclera: Conjunctivae normal.     Pupils: Pupils are equal, round, and reactive to light.  Neck:     Musculoskeletal: Normal range of motion.   Cardiovascular:     Rate and Rhythm: Normal rate and regular rhythm.     Heart sounds: Normal heart sounds.  Pulmonary:     Effort: Pulmonary effort is normal.     Breath sounds: Normal breath sounds.  Abdominal:     General: Bowel sounds are normal.     Palpations: Abdomen is soft.     Tenderness: There is abdominal tenderness in the right upper quadrant.  Musculoskeletal: Normal range of motion.  Skin:    General: Skin is warm and dry.  Neurological:     Mental Status: He is alert and oriented to person, place, and time.      ED Treatments / Results  Labs (all labs ordered are listed, but only abnormal results are displayed) Labs Reviewed  COMPREHENSIVE METABOLIC PANEL - Abnormal; Notable for the following components:      Result Value   Glucose, Bld 104 (*)    AST 119 (*)    ALT 83 (*)    All other components within normal limits  LIPASE, BLOOD  CBC  URINALYSIS, ROUTINE W REFLEX MICROSCOPIC    EKG None  Radiology US Abdomen Limited Ruq  Result Date: 07/11/2019 CLINICAL DATA:  Right upper quadrant pain. EXAM: ULTRASOUND ABDOMEN LIMITED RIGHT UPPER QUADRANT COMPARISON:  None. FINDINGS: Gallbladder: Physiologically distended. No gallstones or wall thickening visualized. No sonographic Murphy sign noted by sonographer. Common bile duct: Diameter: 3 mm, normal. Liver: No focal lesion identified. Heterogeneously increased in parenchymal echogenicity. Portal vein is patent on color Doppler imaging with normal direction of blood flow towards the liver. Other: None. IMPRESSION: 1. Normal sonographic appearance of gallbladder and biliary tree. No gallstones. 2. Increased hepatic echogenicity most commonly seen with hepatic steatosis. Electronically Signed   By: Keith Rake M.D.   On: 07/11/2019 23:09    Procedures Procedures (including critical care time)  Medications Ordered in ED Medications  sodium chloride flush (NS) 0.9 % injection 3 mL (3 mLs Intravenous Given  07/12/19 0009)  morphine 4 MG/ML injection 4 mg (4 mg Intravenous Given 07/11/19 2310)  ondansetron (ZOFRAN) injection 4 mg (4 mg Intravenous Given 07/11/19 2310)  oxyCODONE (Oxy IR/ROXICODONE) immediate release tablet 5 mg (5 mg Oral Given 07/11/19 2344)     Initial Impression / Assessment and Plan / ED Course  I have reviewed the triage vital signs and the nursing notes.  Pertinent labs & imaging results that were available during my care of the patient were reviewed by me and considered in my medical decision making (see chart for details).  48 year old male here with right upper quadrant pain for the past 2 days.  He has  had some nausea but no vomiting or diarrhea.  Bowel movements have reportedly been normal.  He is afebrile and nontoxic.  Some mild tenderness in the right upper abdomen but no Murphy sign.  No prior abdominal surgeries.  He does not have any obstructive type symptoms.  Labs are pending.  Plan for right upper quadrant ultrasound.  Given dose of IV morphine and Zofran.  Will reassess.  11:35 PM Patient feeling better after medications here, he is able to lie comfortably in the bed now.  His abdomen is soft and benign currently.  We have discussed his results including his elevated LFTs and findings of fatty liver on exam.  He does admit to drinking fairly heavily a few days ago during his birthday (several beers).  He is not a usual heavy drinker, just on occasion.  This may have some effect.  He is not currently on a statin and denies any heavy Tylenol intake.  Recommended to avoid alcohol and Tylenol for the next few weeks, will need to follow-up with PCP for recheck of LFTs.  He acknowledged understanding of this.  He may return here for any new/acute changes.  Final Clinical Impressions(s) / ED Diagnoses   Final diagnoses:  RUQ pain  Elevated LFTs    ED Discharge Orders         Ordered    ibuprofen (ADVIL) 800 MG tablet  3 times daily     07/11/19 2353     ondansetron (ZOFRAN ODT) 4 MG disintegrating tablet  Every 8 hours PRN     07/11/19 2353           Larene Pickett, PA-C 07/12/19 0148    Fatima Blank, MD 07/12/19 [phone]

## 2019-07-11 NOTE — ED Notes (Addendum)
Pt denies nausea, vomiting, or diarrhea. Pt states he had two stools today but they were formed. Pt complains of pain with eating, that feels like a pressure and bloating more on the right side. Pt denies history of gall bladder disease, pt denies fever.

## 2019-07-11 NOTE — ED Triage Notes (Addendum)
Pt arrived stating he is having upper stomach pains radiate to his back, states that it started on Thursday night. Patient has been taking aleve and icyhot for pain with no relief. Denies any NVD

## 2019-07-11 NOTE — ED Notes (Signed)
Pt in with ultrasound

## 2019-08-22 IMAGING — DX DG CHEST 2V
2 series · 2 of 2 positions shown · non-contrast
Comparison: None.

CLINICAL DATA: 46 y/o  M; chest pain and pressure.

EXAM:
CHEST  2 VIEW

[chest pa]
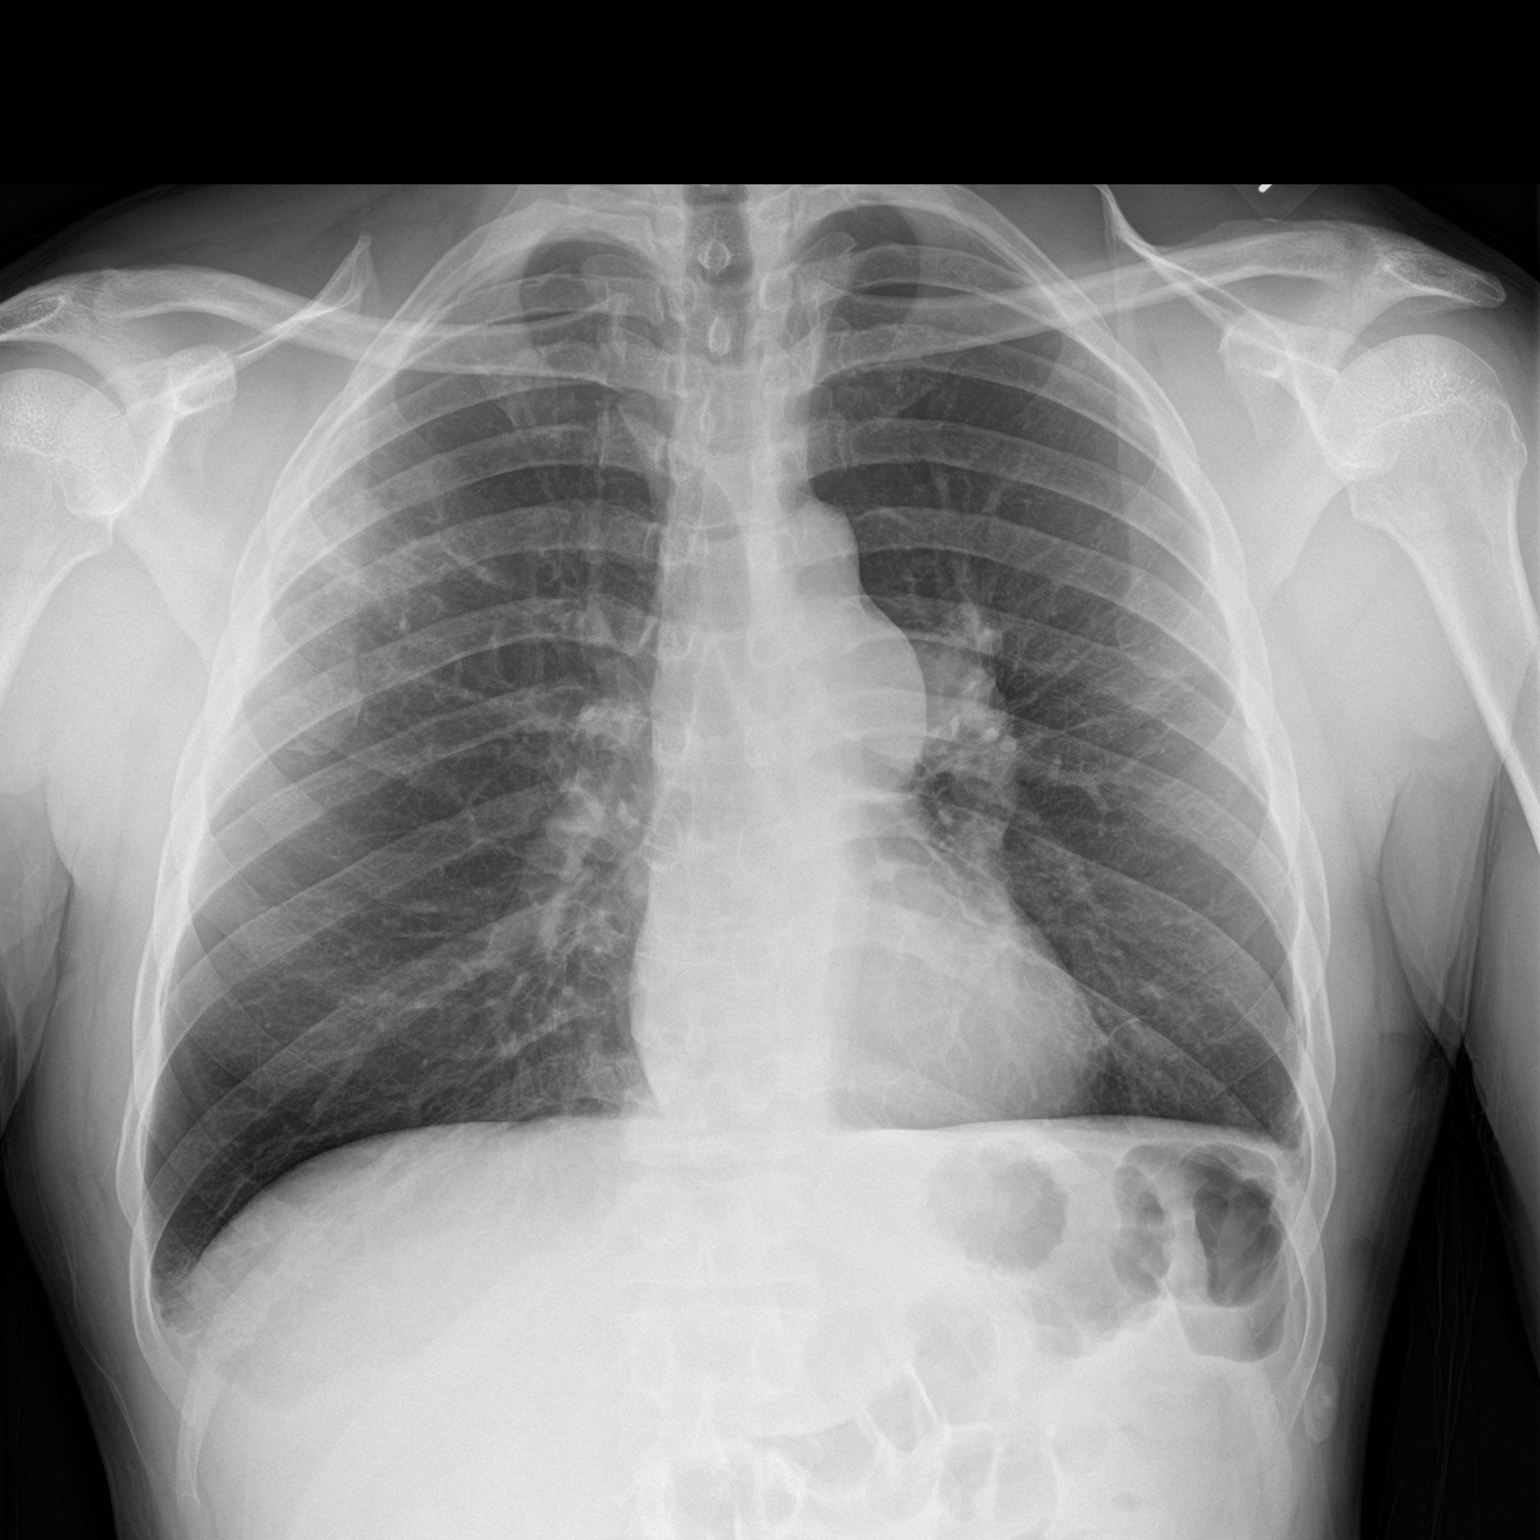

[chest lat]
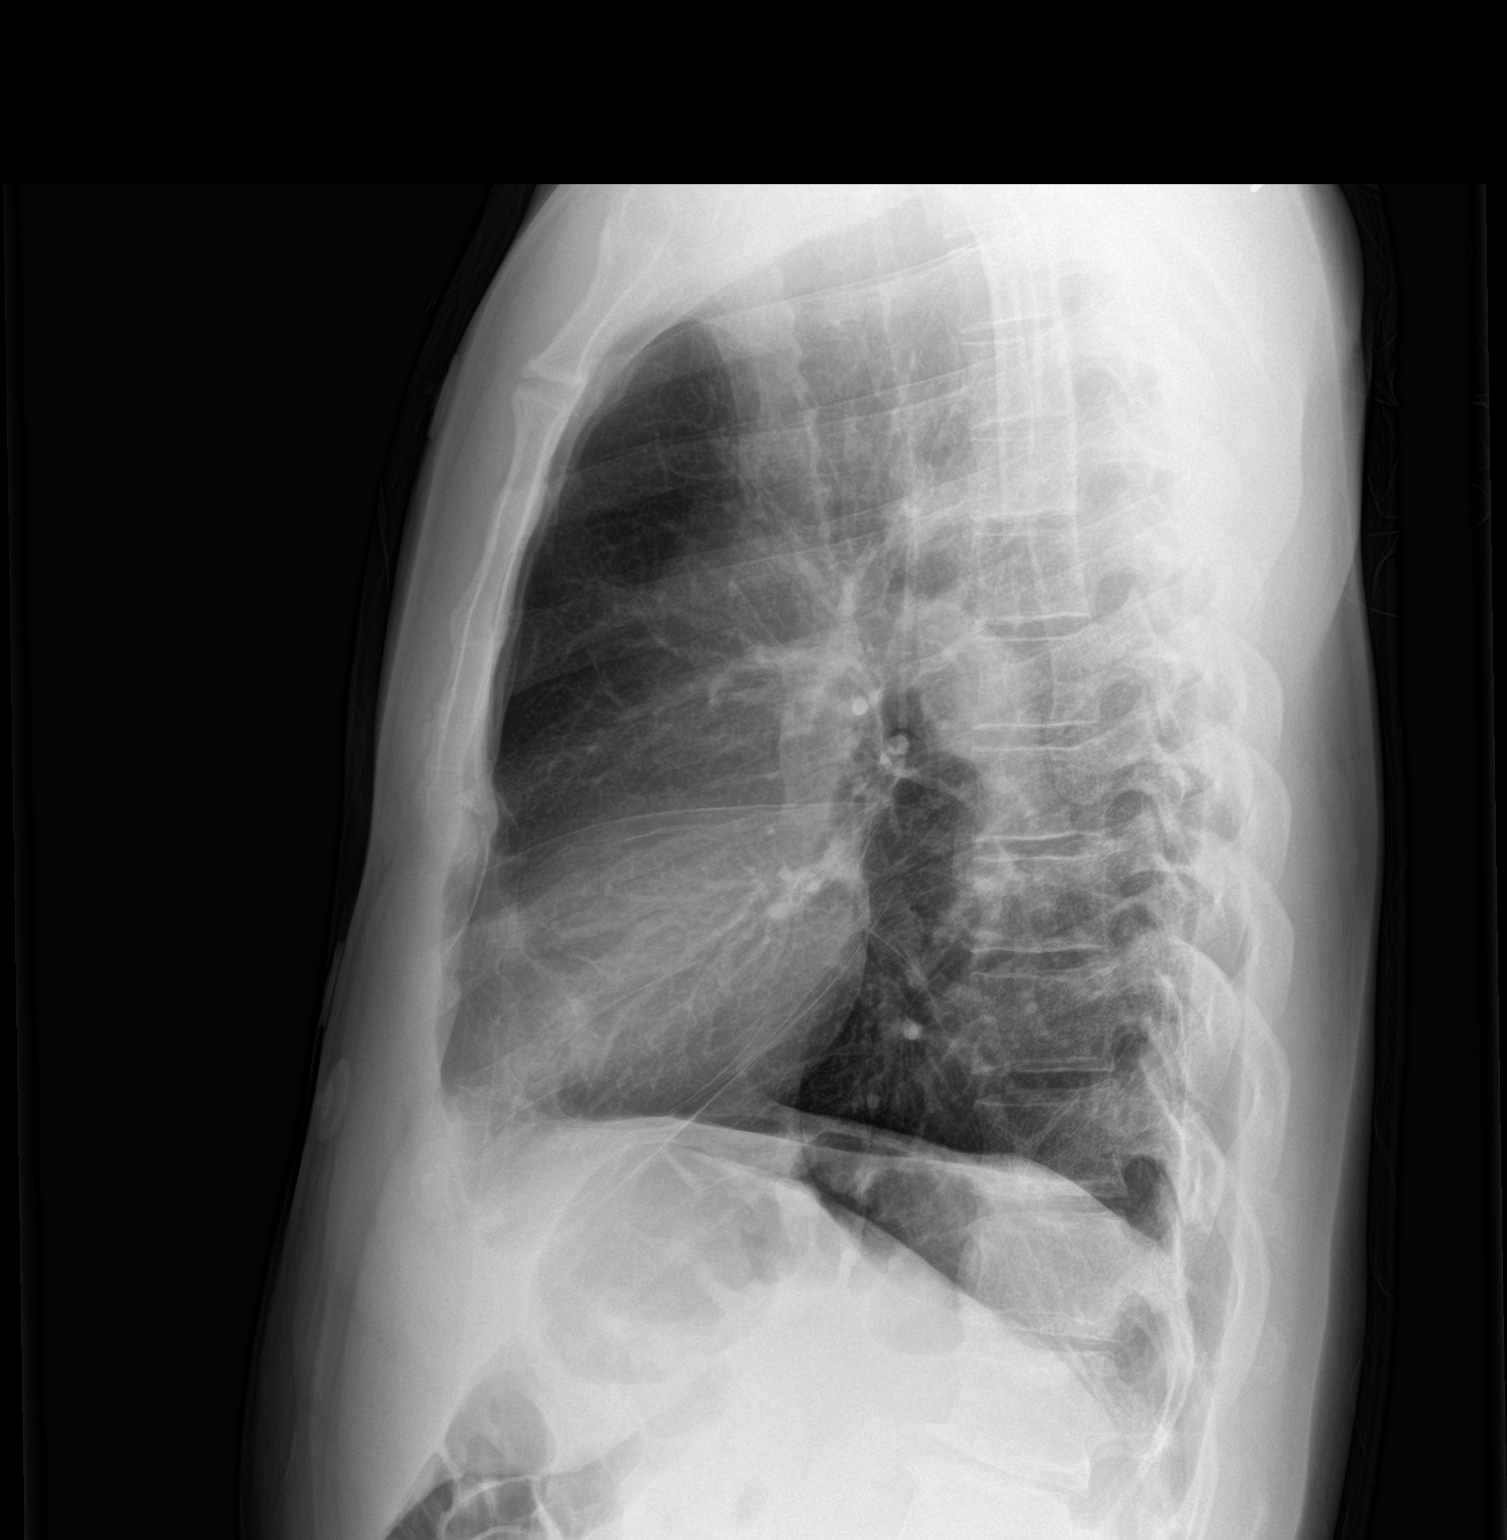

[2 of 2 positions shown; findings below may reference images not displayed]

FINDINGS: Normal heart size. Enlarged pulmonary artery silhouette. Ill-defined
opacity of periphery of right upper lobe. Lungs are hyperinflated
with flattened diaphragms compatible with COPD. Bones are
unremarkable.
IMPRESSION: Ill-defined opacity a periphery of right upper lobe may represent
pneumonia or atelectasis. Findings of COPD. Enlarged main pulmonary
suggests pulmonary artery hypertension.

By: Lepi Baset M.D.

## 2019-11-16 ENCOUNTER — Other Ambulatory Visit: Payer: Self-pay | Admitting: Pulmonary Disease

## 2019-11-16 DIAGNOSIS — J309 Allergic rhinitis, unspecified: Secondary | ICD-10-CM

## 2019-11-16 DIAGNOSIS — J449 Chronic obstructive pulmonary disease, unspecified: Secondary | ICD-10-CM

## 2020-04-10 IMAGING — DX DG CHEST 2V
2 series · 2 of 2 positions shown · non-contrast
Comparison: 11/11/2017

CLINICAL DATA: COPD

EXAM:
CHEST - 2 VIEW

[chest pa]
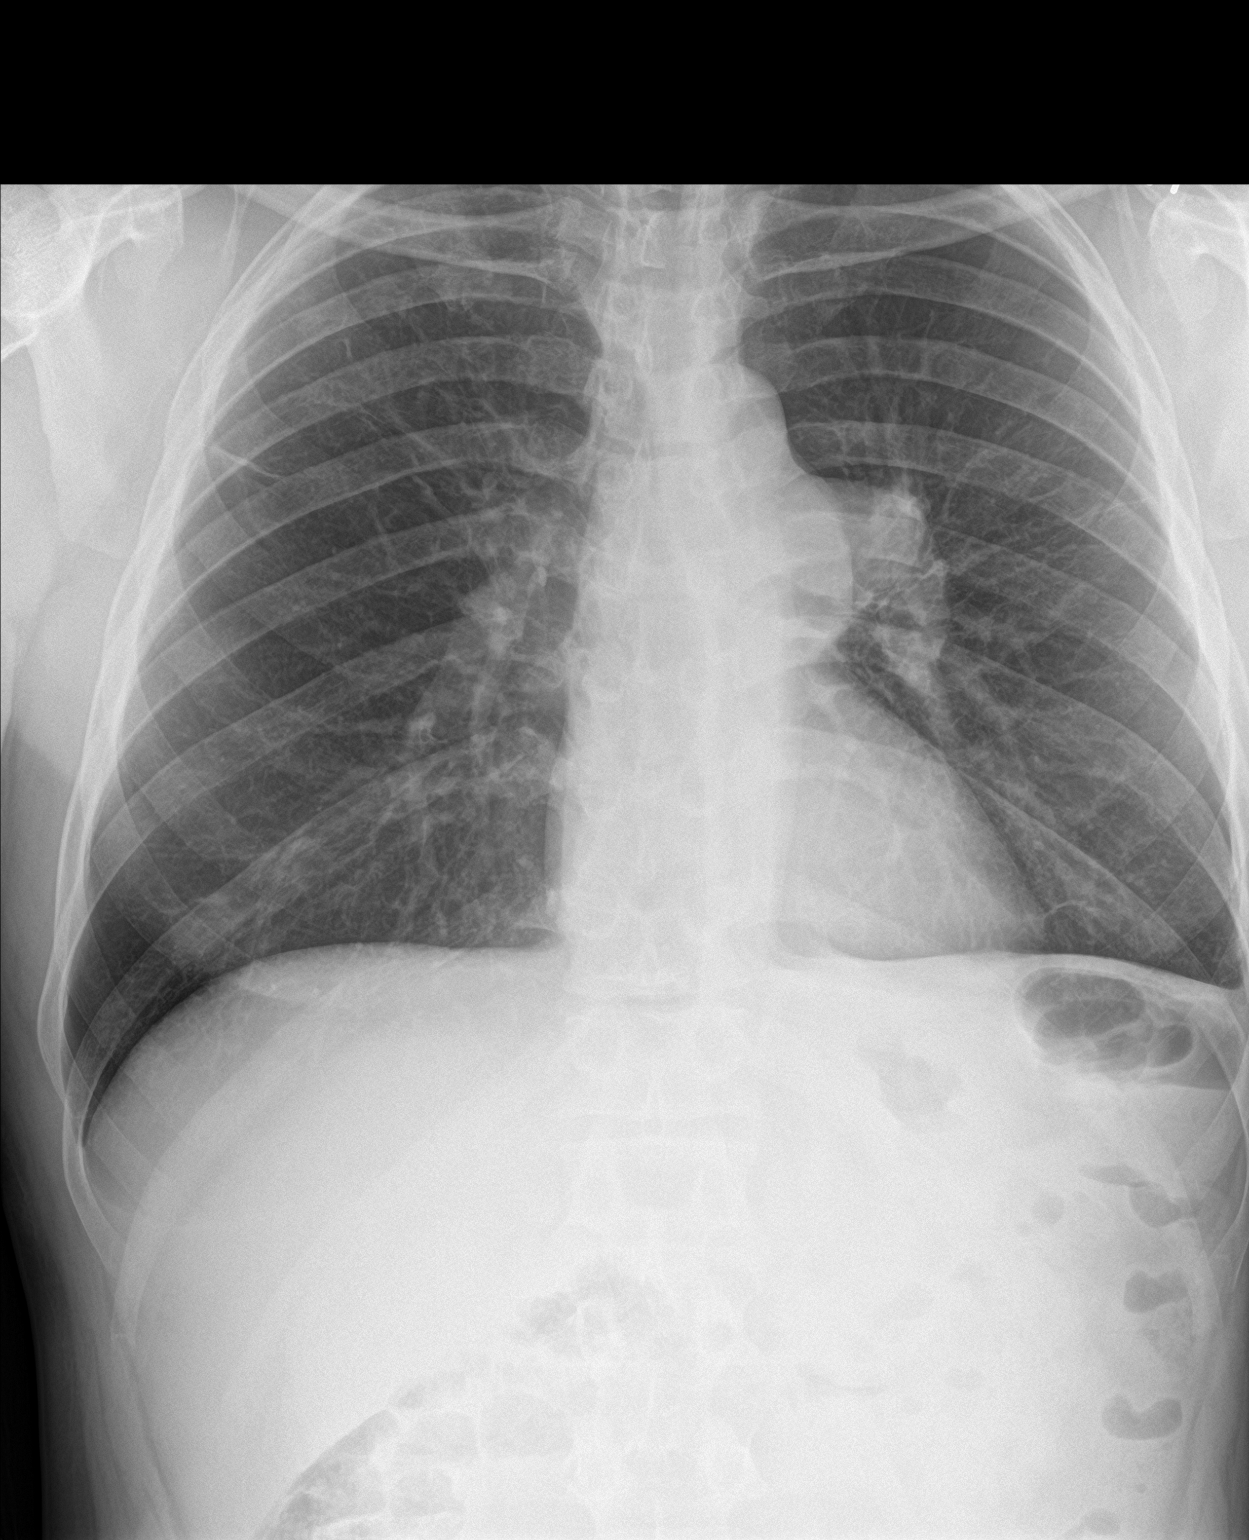

[chest lat]
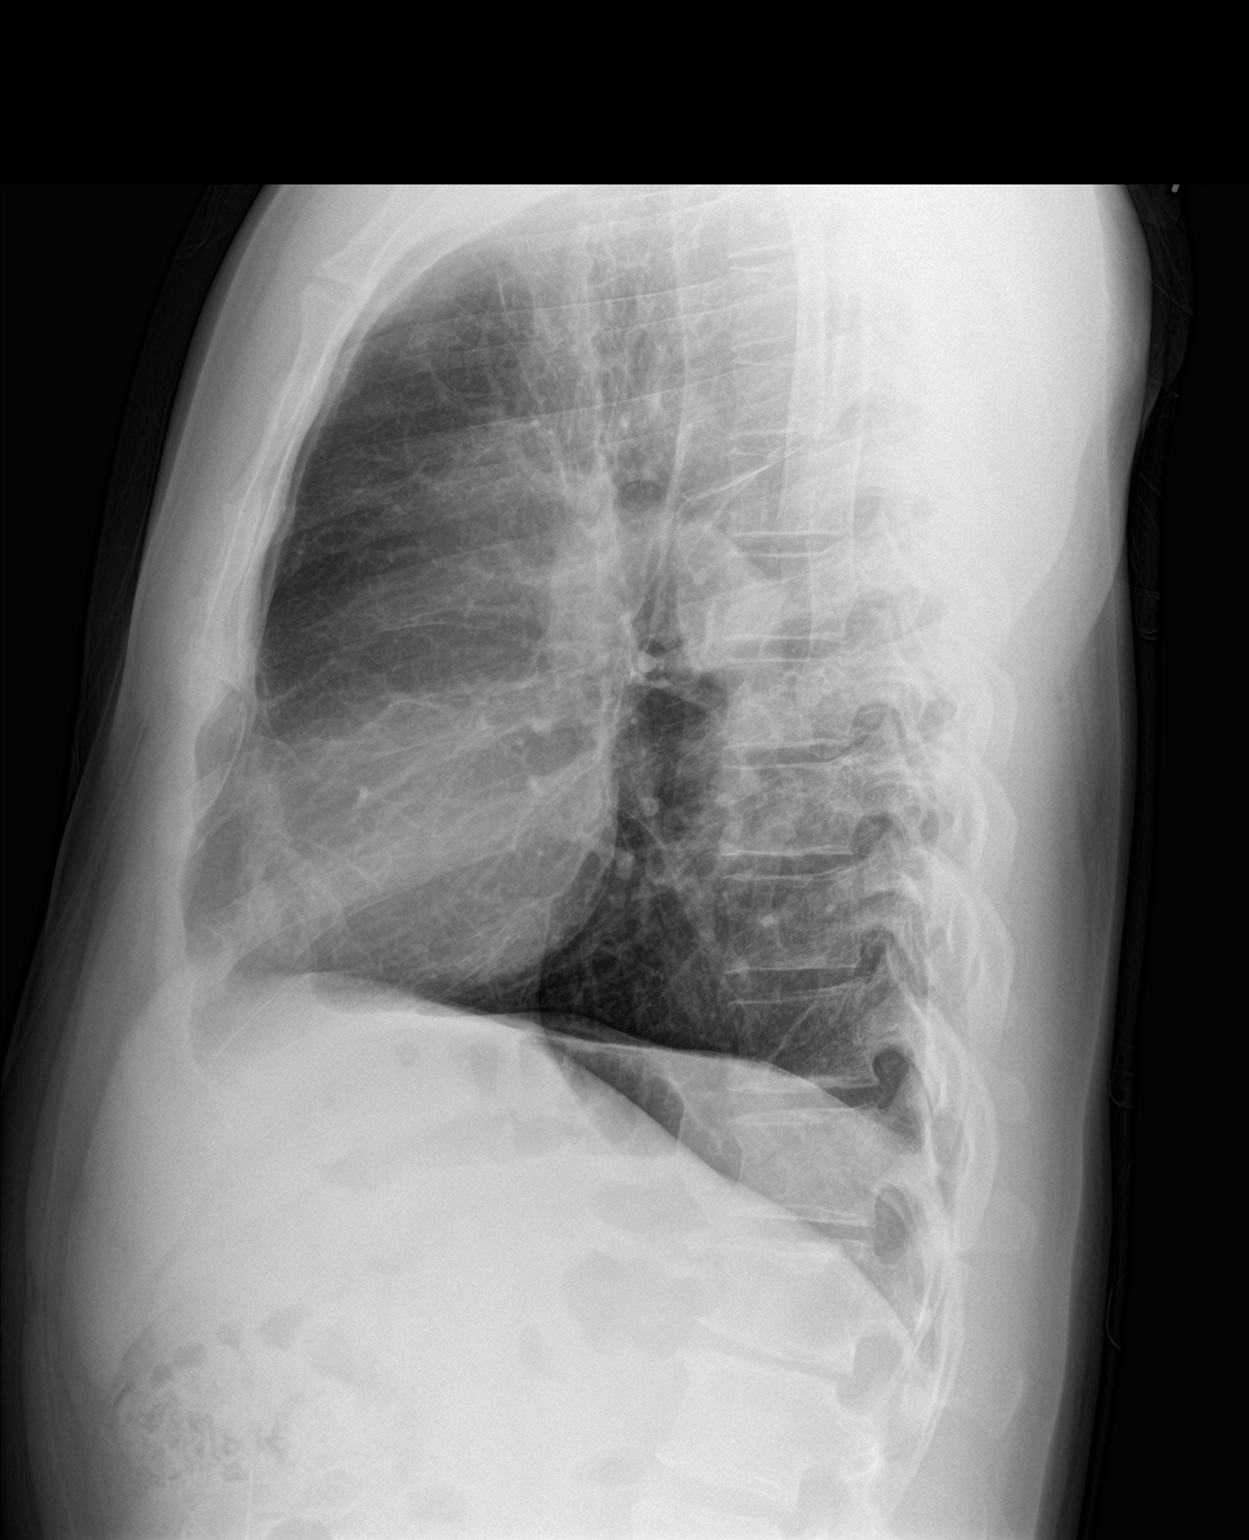

[2 of 2 positions shown; findings below may reference images not displayed]

FINDINGS: The lungs are clear without focal pneumonia, edema, pneumothorax or
pleural effusion. Stable prominence of the pulmonary arteries.
Atelectasis or scarring noted in the mid lungs bilaterally. The
cardiopericardial silhouette is within normal limits for size. The
visualized bony structures of the thorax are intact.
IMPRESSION: Stable exam without new or progressive interval findings.

## 2022-04-05 ENCOUNTER — Ambulatory Visit: Payer: 59 | Admitting: Internal Medicine

## 2022-04-05 ENCOUNTER — Encounter: Payer: Self-pay | Admitting: Internal Medicine

## 2022-04-05 VITALS — BP 126/90 | HR 79 | Temp 98.1°F | Resp 14 | Ht 73.0 in | Wt 171.6 lb

## 2022-04-05 DIAGNOSIS — M20001 Unspecified deformity of right finger(s): Secondary | ICD-10-CM | POA: Diagnosis not present

## 2022-04-05 DIAGNOSIS — F1210 Cannabis abuse, uncomplicated: Secondary | ICD-10-CM

## 2022-04-05 DIAGNOSIS — Z720 Tobacco use: Secondary | ICD-10-CM

## 2022-04-05 DIAGNOSIS — J4489 Other specified chronic obstructive pulmonary disease: Secondary | ICD-10-CM

## 2022-04-05 DIAGNOSIS — G4733 Obstructive sleep apnea (adult) (pediatric): Secondary | ICD-10-CM

## 2022-04-05 DIAGNOSIS — Z119 Encounter for screening for infectious and parasitic diseases, unspecified: Secondary | ICD-10-CM | POA: Diagnosis not present

## 2022-04-05 DIAGNOSIS — J850 Gangrene and necrosis of lung: Secondary | ICD-10-CM

## 2022-04-05 DIAGNOSIS — Z0000 Encounter for general adult medical examination without abnormal findings: Secondary | ICD-10-CM | POA: Diagnosis not present

## 2022-04-05 DIAGNOSIS — Z125 Encounter for screening for malignant neoplasm of prostate: Secondary | ICD-10-CM

## 2022-04-05 DIAGNOSIS — C61 Malignant neoplasm of prostate: Secondary | ICD-10-CM | POA: Insufficient documentation

## 2022-04-05 DIAGNOSIS — Z8701 Personal history of pneumonia (recurrent): Secondary | ICD-10-CM

## 2022-04-05 DIAGNOSIS — Z8249 Family history of ischemic heart disease and other diseases of the circulatory system: Secondary | ICD-10-CM

## 2022-04-05 DIAGNOSIS — Z2821 Immunization not carried out because of patient refusal: Secondary | ICD-10-CM

## 2022-04-05 DIAGNOSIS — J449 Chronic obstructive pulmonary disease, unspecified: Secondary | ICD-10-CM

## 2022-04-05 DIAGNOSIS — Z1211 Encounter for screening for malignant neoplasm of colon: Secondary | ICD-10-CM

## 2022-04-05 DIAGNOSIS — R053 Chronic cough: Secondary | ICD-10-CM

## 2022-04-05 HISTORY — DX: Chronic cough: R05.3

## 2022-04-05 HISTORY — DX: Family history of ischemic heart disease and other diseases of the circulatory system: Z82.49

## 2022-04-05 HISTORY — DX: Obstructive sleep apnea (adult) (pediatric): G47.33

## 2022-04-05 HISTORY — DX: Encounter for screening for malignant neoplasm of prostate: Z12.5

## 2022-04-05 HISTORY — DX: Cannabis abuse, uncomplicated: F12.10

## 2022-04-05 HISTORY — DX: Unspecified deformity of right finger(s): M20.001

## 2022-04-05 HISTORY — DX: Personal history of pneumonia (recurrent): Z87.01

## 2022-04-05 LAB — CBC WITH DIFFERENTIAL/PLATELET
Basophils Absolute: 0 10*3/uL (ref 0.0–0.1)
Basophils Relative: 0.5 % (ref 0.0–3.0)
Eosinophils Absolute: 0.1 10*3/uL (ref 0.0–0.7)
Eosinophils Relative: 1 % (ref 0.0–5.0)
HCT: 45.2 % (ref 39.0–52.0)
Hemoglobin: 15 g/dL (ref 13.0–17.0)
Lymphocytes Relative: 22.6 % (ref 12.0–46.0)
Lymphs Abs: 1.4 10*3/uL (ref 0.7–4.0)
MCHC: 33.2 g/dL (ref 30.0–36.0)
MCV: 88.8 fl (ref 78.0–100.0)
Monocytes Absolute: 0.7 10*3/uL (ref 0.1–1.0)
Monocytes Relative: 11.8 % (ref 3.0–12.0)
Neutro Abs: 4 10*3/uL (ref 1.4–7.7)
Neutrophils Relative %: 64.1 % (ref 43.0–77.0)
Platelets: 251 10*3/uL (ref 150.0–400.0)
RBC: 5.09 Mil/uL (ref 4.22–5.81)
RDW: 13.1 % (ref 11.5–15.5)
WBC: 6.3 10*3/uL (ref 4.0–10.5)

## 2022-04-05 LAB — COMPREHENSIVE METABOLIC PANEL
ALT: 27 U/L (ref 0–53)
AST: 52 U/L — ABNORMAL HIGH (ref 0–37)
Albumin: 4.5 g/dL (ref 3.5–5.2)
Alkaline Phosphatase: 78 U/L (ref 39–117)
BUN: 12 mg/dL (ref 6–23)
CO2: 23 mEq/L (ref 19–32)
Calcium: 9.5 mg/dL (ref 8.4–10.5)
Chloride: 103 mEq/L (ref 96–112)
Creatinine, Ser: 1.02 mg/dL (ref 0.40–1.50)
GFR: 86.16 mL/min (ref >60.00)
Glucose, Bld: 88 mg/dL (ref 70–99)
Potassium: 4.4 mEq/L (ref 3.5–5.1)
Sodium: 139 mEq/L (ref 135–145)
Total Bilirubin: 0.4 mg/dL (ref 0.2–1.2)
Total Protein: 7.4 g/dL (ref 6.0–8.3)

## 2022-04-05 LAB — LIPID PANEL
Cholesterol: 209 mg/dL — ABNORMAL HIGH (ref 0–200)
HDL: 112.4 mg/dL (ref >39.00)
LDL Cholesterol: 67 mg/dL (ref 0–99)
NonHDL: 96.98
Total CHOL/HDL Ratio: 2
Triglycerides: 150 mg/dL — ABNORMAL HIGH (ref 0.0–149.0)
VLDL: 30 mg/dL (ref 0.0–40.0)

## 2022-04-05 LAB — PSA: PSA: 0.25 ng/mL (ref 0.10–4.00)

## 2022-04-05 MED ORDER — BUPROPION HCL ER (XL) 150 MG PO TB24: 150.0000 mg | ORAL_TABLET | Freq: Every day | ORAL | 3 refills | Status: DC

## 2022-04-05 MED ORDER — NICOTINE 10 MG/ML NA SOLN: 1.0000 mg | Freq: Every day | NASAL | 0 refills | Status: AC

## 2022-04-05 NOTE — Assessment & Plan Note (Addendum)
Get xr chest, too young for ldct screening but advised patient to call back for ct chest for his reported cough and dyspnea if it is bad or a/w hemoptysis.

## 2022-04-05 NOTE — Assessment & Plan Note (Signed)
Advised that I dont think this is medical, so I dont promote it, but also I think that as a recreational drug its much less of a medical concern than tobacco and alcohol.  Encouraged him to eliminate tobacco and alcohol preferentially.

## 2022-04-05 NOTE — Assessment & Plan Note (Signed)
Discussed psa likely wont be covered but he would like to get psa testing anyways using shared decision making.

## 2022-04-05 NOTE — Assessment & Plan Note (Signed)
Refer gi discussed need for c-scope

## 2022-04-05 NOTE — Assessment & Plan Note (Signed)
Encouraged continued cpap adherence

## 2022-04-05 NOTE — Progress Notes (Signed)
Phone [phone]  Chief Complaint:  Alex Maldonado is a 51 y.o. male who presents today for his annual comprehensive physical exam.    Assessment/Plan:  Overview:  Chief Complaint  Patient presents with   Establish Care    Pt here to establish care with a PCP. Wants to check general overall health. No concerns.    This is not part of an employment physical, just general wellness concern   Alex Maldonado was seen today for establish care.  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  Orders: -     DG Chest 2 View; Future  History of pneumonia  COPD with chronic bronchitis and emphysema ([place]) Overview: Followed with pulmonary in past but lost to f/u  Assessment & Plan: Declines any inhalers from me at this time Agrees to pulm re-referral Offered covid vaccine History of severe pneumonia and copd- offered prevnar 20.  Orders: -     Ambulatory referral to Pulmonology  Tobacco abuse Overview: Started age 34ish, slowly increased to 0.5 ppd dropped down to 0.25 ppd, then when brother died 2022/04/21 Encouraged cessation He agreed to try wellbutrin.   Orders: -     buPROPion HCl ER (XL); Take 1 tablet (150 mg total) by mouth daily.  Dispense: 90 tablet; Refill:  3 -     Nicotine; Place 0.1 mLs (1 mg total) into the nose daily for 90 doses.  Dispense: 9 mL; Refill: 0  Family history of aneurysm Overview: Brother died 15ish suddently of aneurysm.    Prostate cancer screening information given during patient encounter Assessment & Plan: Ordered psa after shared decision making  Orders: -     PSA  Colon cancer screening Assessment & Plan: Refer gi discussed need for c-scope  Orders: -     Ambulatory referral to Gastroenterology  OSA (obstructive sleep apnea) Overview: cpap setting at 15  Assessment & Plan: Encouraged continued cpap adherence   COVID-19 vaccine dose declined Overview: Also declined prevnar 20 boost 04/21/22    Cannabis abuse, episodic Assessment & Plan: Advised that I dont think this is medical, so I dont promote it, but also I think that as a recreational drug its much less of a medical concern than tobacco and alcohol.  Encouraged him to eliminate tobacco and alcohol preferentially.       Health Maintenance counseling and anticipatory guidance:   #  Eye exams - encouraged regularly but he has no vision issues #  Dental health: Discussed importance of regular tooth brushing, flossing, and dental visits q6 mo. #  Diet/Exercise:   Advised patient of need for regular exercise and diet rich and fruits and vegetables and healthy fats  to reduce risk of heart attack and stroke.  Wt Readings from Last 3 Encounters:  2022/04/08 171 lb 9.6 oz (77.8 kg)  07/11/19 175 lb (79.4 kg)  10/23/18 184 lb (83.5 kg)   Body mass index is 22.64 kg/m. / advised healthy weight   #  Health maintenance and immunizations reviewed: Immunization History  Administered Date(s) Administered   Influenza Inj Mdck Quad Pf 06/03/2018   Pneumococcal Polysaccharide-23 03/03/2018   Health Maintenance Due  Topic Date Due   Hepatitis C Screening  Never done   COLONOSCOPY (Pts 45-73yr Insurance coverage will need to be confirmed)  Never  done    #  Testicular cancer screening - recommend check daily #  Prostate cancer screening: -  checking today after shared decision making #  Sexuality: Discussed sexually transmitted diseases, partner selection, use of condoms #  Colon cancer screening - ordering now after discussing increased ethnicity risks. #  Skin cancer screening- advised regular sunscreen use. Denies worrisome, changing, or new skin lesions.  #  Substance use:  current smoker, discussed cessation/primary prevention of tobacco, alcohol, or other drug use; driving or other dangerous activities under the influence; availability of treatment for abuse.  Important to avoid smoking and second hand smoke , limit alcohol to 1 beverage per day(he is drinking beer and I encouraged him to cut back)  , no illicit drugs.   #  Injury prevention: Discussed safety belts, safety helmets, smoke detector, smoking near bedding or upholstery.  #  Return to care in 1 year for next preventative visit.    RLoralee Pacas MD     Subjective:   HPI: See problem oriented charting-in assessment & plan  He has no acute complaints today.   He mentioned he had some foot pain last week but its mostly gone now and not concerned about it, not wanting it addressed today  Lifestyle Diet: denies anything special in diet... 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  Review of Systems  Respiratory:  Positive for cough and shortness of breath.   All other systems reviewed and are negative.  Comprehensive ros was taken and otherwise negative. The ros only pos during asthma flares  The following were reviewed and entered/updated in epic:    808-14-23   8:09 AM  Depression screen PHQ 2/9  Decreased Interest 0  Down, Depressed, Hopeless 0  PHQ - 2 Score 0   Past Medical History:  Diagnosis Date   Asthma    Cannabis abuse, episodic 008/14/23  Advised that I  dont think this is medical, so I dont promote it, but also I think that as a recreational drug its much less of a medical concern than tobacco and alcohol.  Encouraged him to eliminate tobacco and alcohol preferentially.   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, right 04/05/2022   Chronic cough 04/05/2022   History of pneumonia 04/05/2022   Prostate cancer ([place]) 04/05/2022   Family history of aneurysm 04/05/2022   Prostate cancer screening information given during patient encounter 04/05/2022   Colon cancer screening 04/05/2022   OSA (obstructive sleep apnea) 04/05/2022   COVID-19 vaccine dose declined 04/05/2022   Cannabis abuse, episodic 04/05/2022   Allergic rhinitis 12/10/2018   COPD with chronic bronchitis and emphysema ([place]) 11/05/2017   Tobacco abuse    Heart murmur 08/15/2017   Malnutrition of moderate degree 08/15/2017   History reviewed. No pertinent surgical history. History reviewed. No pertinent family history.  I attest that I have reviewed and confirmed the patients current medications to meet the medication reconciliation requirement .  He is taking tumeric and many other herbals but doesn't have with him to reconcile today but will bring next time.  He doesn't want to take any inhalers. Current Outpatient Medications   Medication Sig Dispense Refill   buPROPion (WELLBUTRIN XL) 150 MG 24 hr tablet Take 1 tablet (150 mg total) by mouth daily. 90 tablet 3   Multiple Vitamin (MULTIVITAMIN WITH MINERALS) TABS tablet Take 1 tablet by mouth daily.     Nicotine 10 MG/ML SOLN Place 0.1 mLs (1 mg total) into the nose daily for 90 doses. 9 mL 0   No current facility-administered medications for this visit.    Allergies-reviewed and updated No Known Allergies  Social History   Tobacco Use   Smoking status: Former    Types: Cigarettes   Smokeless tobacco: Never   Tobacco comments:    quit 07/14/17  Substance Use Topics   Alcohol use: Yes    Comment: beer   Drug use: Yes    Types: Marijuana           Objective:  Physical Exam: BP (!) 126/90 (BP Location: Left Arm)   Pulse 79   Temp 98.1 F (36.7 C) (Temporal)   Resp 14   Ht '6\' 1"'$  (1.854 m)   Wt 171 lb 9.6 oz (77.8 kg)   SpO2 (!) 89%   BMI 22.64 kg/m   Body mass index is 22.64 kg/m. Wt Readings from Last 3 Encounters:  04/05/22 171 lb 9.6 oz (77.8 kg)  07/11/19 175 lb (79.4 kg)  10/23/18 184 lb (83.5 kg)   Gen: NAD, resting comfortably HEENT: TMs normal bilaterally. OP clear. No thyromegaly noted.  CV: RRR with no murmurs appreciated. Very loud heart sounds Pulm: NWOB, CTAB with no crackles, wheezes, or rhonchi-emphysematous sounds can be heard MSK: no edema, cyanosis, or clubbing noted Skin: warm, dry Neuro: CN2-12 grossly intact. Psych: Normal affect and thought content

## 2022-04-05 NOTE — Assessment & Plan Note (Signed)
Ordered psa after shared decision making

## 2022-04-05 NOTE — Assessment & Plan Note (Addendum)
Declines any inhalers from me at this time Agrees to pulm re-referral Offered covid vaccine History of severe pneumonia and copd- offered prevnar 20.

## 2022-04-08 LAB — HEPATITIS C ANTIBODY: Hepatitis C Ab: NONREACTIVE

## 2022-04-08 LAB — HIV ANTIBODY (ROUTINE TESTING W REFLEX): HIV 1&2 Ab, 4th Generation: NONREACTIVE

## 2022-04-19 ENCOUNTER — Telehealth (INDEPENDENT_AMBULATORY_CARE_PROVIDER_SITE_OTHER): Payer: 59 | Admitting: Internal Medicine

## 2022-04-19 ENCOUNTER — Encounter: Payer: Self-pay | Admitting: Internal Medicine

## 2022-04-19 VITALS — Ht 73.0 in

## 2022-04-19 DIAGNOSIS — Z789 Other specified health status: Secondary | ICD-10-CM

## 2022-04-19 DIAGNOSIS — Z720 Tobacco use: Secondary | ICD-10-CM | POA: Diagnosis not present

## 2022-04-19 MED ORDER — NICOTINE POLACRILEX 4 MG MT GUM: 4.0000 mg | CHEWING_GUM | OROMUCOSAL | 5 refills | Status: DC | PRN

## 2022-04-19 MED ORDER — NALTREXONE HCL 50 MG PO TABS: 50.0000 mg | ORAL_TABLET | Freq: Every day | ORAL | 3 refills | Status: DC

## 2022-04-19 NOTE — Assessment & Plan Note (Signed)
5 minute motivational interviewing Desire to quit 8/10 due to kids, losing a brother to smoking related dz, parents up and age and he wants to be around Was avoidant of wellbutrin due to side effects. He is interested in returning to [place] Triggers include drinking, bad news, meals Weight lifting helps manage triggers and praying- I told him a little about mindfulness.

## 2022-04-19 NOTE — Progress Notes (Signed)
Virtual Medical Office Visit  Patient:  Alex Maldonado      Age: 51 y.o.       Sex:  male  Date:   04/19/2022  PCP:    Alex Maldonado, [place] Provider: Loralee Pacas, MD   Assessment/Plan:   Alex Maldonado was seen today for medication plan.  Tobacco abuse Overview: Started age 13ish, slowly increased to 0.5 ppd dropped down to 0.25 ppd, then when brother died 04-13-22 Encouraged cessation  Assessment & Plan: 5 minute motivational interviewing Desire to quit 8/10 due to kids, losing a brother to smoking related dz, parents up and age and he wants to be around Was avoidant of wellbutrin due to side effects. He is interested in returning to [place] Triggers include drinking, bad news, meals Weight lifting helps manage triggers and praying- I told him a little about mindfulness.  Orders: -     Nicotine Polacrilex; Take 1 each (4 mg total) by mouth as needed for smoking cessation (for nicotine replacement.).  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 Down to about 4 but its difficult.    Never been hospitalized with quitting alcohol. Will trial naltrexone and slowly stopping avoiding cold [place] Encouraged return to AA Encouraged f/u in 2-3 weeks and see if any progress  Orders: -     Naltrexone HCl; Take 1 tablet (50 mg total) by mouth daily.  Dispense: 90 tablet; Refill: 3     No follow-ups on file.   He was advised to call the office or go to ER if his condition worsens    Subjective:   Alex Maldonado is a 51 y.o. male with PMH significant for: Past Medical History:  Diagnosis Date   Asthma    Cannabis abuse, episodic 04/13/22   Advised that I dont think this is medical, so I dont promote it, but also I think that as a recreational drug its much less of a medical concern than tobacco and alcohol.  Encouraged him to eliminate tobacco and  alcohol preferentially.   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    Heart murmur    History of pneumonia April 13, 2022   Hypertension    OSA (obstructive sleep apnea) 04-13-2022   cpap setting at 16   Prostate cancer screening information given during patient encounter Apr 13, 2022   Right upper lobe pneumonia 08/15/2017   Sleep apnea      Presenting today with: Chief Complaint  Patient presents with   Medication Plan    Discuss customized medication plan for cessation of smoking and aclohol use. Has not used the Wellbutrin due to the side effects. Has not picked up the prescription for the nicotine solution.     He clarifies and reports that his condition: Is alcohol and cigarettes cessation is proving difficult all though he cut back some, but didn't want wellbutrin due to s/e listings  He denies having any: Tremors, shakes, withdrawal symptoms          Objective:  Physical Exam: Ht 6' 1"  (1.854 m)  BMI 22.64 kg/m    Gen: No acute distress, resting comfortably Psych: Normal affect and thought content  Problem specific physical exam findings:  No tremors, very charismatic and comfortable appearing    Results:   Recent Results (from the past 2160 hour(s))  CBC w/Diff     Status: None   Collection Time: 04/05/22  9:09 AM  Result Value Ref Range   WBC 6.3 4.0 - 10.5 K/uL   RBC 5.09 4.22 - 5.81 Mil/uL   Hemoglobin 15.0 13.0 - 17.0 g/dL   HCT 45.2 39.0 - 52.0 %   MCV 88.8 78.0 - 100.0 fl   MCHC 33.2 30.0 - 36.0 g/dL   RDW 13.1 11.5 - 15.5 %   Platelets 251.0 150.0 - 400.0 K/uL   Neutrophils Relative % 64.1 43.0 - 77.0 %   Lymphocytes Relative 22.6 12.0 - 46.0 %   Monocytes  Relative 11.8 3.0 - 12.0 %   Eosinophils Relative 1.0 0.0 - 5.0 %   Basophils Relative 0.5 0.0 - 3.0 %   Neutro Abs 4.0 1.4 - 7.7 K/uL   Lymphs Abs 1.4 0.7 - 4.0 K/uL   Monocytes Absolute 0.7 0.1 - 1.0 K/uL   Eosinophils Absolute 0.1 0.0 - 0.7 K/uL   Basophils Absolute 0.0 0.0 - 0.1 K/uL  Comp Met (CMET)     Status: Abnormal   Collection Time: 04/05/22  9:09 AM  Result Value Ref Range   Sodium 139 135 - 145 mEq/L   Potassium 4.4 3.5 - 5.1 mEq/L   Chloride 103 96 - 112 mEq/L   CO2 23 19 - 32 mEq/L   Glucose, Bld 88 70 - 99 mg/dL   BUN 12 6 - 23 mg/dL   Creatinine, Ser 1.02 0.40 - 1.50 mg/dL   Total Bilirubin 0.4 0.2 - 1.2 mg/dL   Alkaline Phosphatase 78 39 - 117 U/L   AST 52 (H) 0 - 37 U/L   ALT 27 0 - 53 U/L   Total Protein 7.4 6.0 - 8.3 g/dL   Albumin 4.5 3.5 - 5.2 g/dL   GFR 86.16 >60.00 mL/min    Comment: Calculated using the CKD-EPI Creatinine Equation (2021)   Calcium 9.5 8.4 - 10.5 mg/dL  Lipid panel     Status: Abnormal   Collection Time: 04/05/22  9:09 AM  Result Value Ref Range   Cholesterol 209 (H) 0 - 200 mg/dL    Comment: ATP III Classification       Desirable:  < 200 mg/dL               Borderline High:  200 - 239 mg/dL          High:  > = 240 mg/dL   Triglycerides 150.0 (H) 0.0 - 149.0 mg/dL    Comment: Normal:  <150 mg/dLBorderline High:  150 - 199 mg/dL   HDL 112.40 >39.00 mg/dL   VLDL 30.0 0.0 - 40.0 mg/dL   LDL Cholesterol 67 0 - 99 mg/dL   Total CHOL/HDL Ratio 2     Comment:                Men          Women1/2 Average Risk     3.4          3.3Average Risk          5.0          4.42X Average Risk          9.6  7.13X Average Risk          15.0          11.0                       NonHDL 96.98     Comment: NOTE:  Non-HDL goal should be 30 mg/dL higher than patient's LDL goal (i.e. LDL goal of < 70 mg/dL, would have non-HDL goal of < 100 mg/dL)  Hepatitis C Antibody     Status: None   Collection Time: 04/05/22  9:09 AM  Result Value Ref Range    Hepatitis C Ab NON-REACTIVE NON-REACTIVE    Comment: . HCV antibody was non-reactive. There is no laboratory  evidence of HCV infection. . In most cases, no further action is required. However, if recent HCV exposure is suspected, a test for HCV RNA (test code [phone]) is suggested. . For additional information please refer to http://education.questdiagnostics.com/faq/FAQ22v1 (This link is being provided for informational/ educational purposes only.) .   HIV antibody (with reflex)     Status: None   Collection Time: 04/05/22  9:09 AM  Result Value Ref Range   HIV 1&2 Ab, 4th Generation NON-REACTIVE NON-REACTIVE    Comment: HIV-1 antigen and HIV-1/HIV-2 antibodies were not detected. There is no laboratory evidence of HIV infection. Marland Kitchen PLEASE NOTE: This information has been disclosed to you from records whose confidentiality may be protected by state law.  If your state requires such protection, then the state law prohibits you from making any further disclosure of the information without the specific written consent of the person to whom it pertains, or as otherwise permitted by law. A general authorization for the release of medical or other information is NOT sufficient for this purpose. . For additional information please refer to http://education.questdiagn[facility].com/faq/FAQ106 (This link is being provided for informational/ educational purposes only.) . Marland Kitchen The performance of this assay has not been clinically validated in patients less than 57 years old. Marland Kitchen   PSA     Status: None   Collection Time: 04/05/22  9:09 AM  Result Value Ref Range   PSA 0.25 0.10 - 4.00 ng/mL    Comment: Test performed using Access Hybritech PSA Assay, a parmagnetic partical, chemiluminecent immunoassay.           Virtual Visit via Video   I connected with Vernona Rieger on 04/19/22 at  2:20 PM EDT by a video enabled telemedicine application and verified that I am speaking with the  correct person using two identifiers. The limitations of evaluation and management by telemedicine and the availability of in person appointments were discussed. The patient expressed understanding and agreed to proceed.   Percentage of appointment time on video:  100% Patient location: Home Provider location: [place] participating in the virtual visit: Myself and Patient

## 2022-04-19 NOTE — Assessment & Plan Note (Addendum)
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 Down to about 4 but its difficult.    Never been hospitalized with quitting alcohol. Will trial naltrexone and slowly stopping avoiding cold [place] Encouraged return to AA Encouraged f/u in 2-3 weeks and see if any progress

## 2022-05-13 ENCOUNTER — Institutional Professional Consult (permissible substitution): Payer: 59 | Admitting: Pulmonary Disease

## 2022-06-28 ENCOUNTER — Institutional Professional Consult (permissible substitution): Payer: 59 | Admitting: Pulmonary Disease

## 2023-03-26 ENCOUNTER — Encounter (INDEPENDENT_AMBULATORY_CARE_PROVIDER_SITE_OTHER): Payer: Self-pay

## 2023-04-11 ENCOUNTER — Encounter: Payer: 59 | Admitting: Internal Medicine

## 2023-04-18 ENCOUNTER — Ambulatory Visit: Payer: 59 | Admitting: Internal Medicine

## 2023-04-18 ENCOUNTER — Encounter: Payer: Self-pay | Admitting: Internal Medicine

## 2023-04-18 VITALS — BP 137/84 | HR 78 | Temp 98.0°F | Ht 73.0 in | Wt 172.8 lb

## 2023-04-18 DIAGNOSIS — E7849 Other hyperlipidemia: Secondary | ICD-10-CM | POA: Diagnosis not present

## 2023-04-18 DIAGNOSIS — Z1211 Encounter for screening for malignant neoplasm of colon: Secondary | ICD-10-CM

## 2023-04-18 DIAGNOSIS — M7918 Myalgia, other site: Secondary | ICD-10-CM

## 2023-04-18 DIAGNOSIS — Z789 Other specified health status: Secondary | ICD-10-CM

## 2023-04-18 DIAGNOSIS — Z579 Occupational exposure to unspecified risk factor: Secondary | ICD-10-CM

## 2023-04-18 DIAGNOSIS — Z029 Encounter for administrative examinations, unspecified: Secondary | ICD-10-CM

## 2023-04-18 DIAGNOSIS — Z720 Tobacco use: Secondary | ICD-10-CM

## 2023-04-18 DIAGNOSIS — Z0001 Encounter for general adult medical examination with abnormal findings: Secondary | ICD-10-CM

## 2023-04-18 DIAGNOSIS — G4733 Obstructive sleep apnea (adult) (pediatric): Secondary | ICD-10-CM

## 2023-04-18 LAB — CBC WITH DIFFERENTIAL/PLATELET
Basophils Absolute: 0 10*3/uL (ref 0.0–0.1)
Basophils Relative: 0.4 % (ref 0.0–3.0)
Eosinophils Absolute: 0 10*3/uL (ref 0.0–0.7)
Eosinophils Relative: 0.2 % (ref 0.0–5.0)
HCT: 49 % (ref 39.0–52.0)
Hemoglobin: 16 g/dL (ref 13.0–17.0)
Lymphocytes Relative: 21.4 % (ref 12.0–46.0)
Lymphs Abs: 1.4 10*3/uL (ref 0.7–4.0)
MCHC: 32.8 g/dL (ref 30.0–36.0)
MCV: 89.8 fl (ref 78.0–100.0)
Monocytes Absolute: 0.6 10*3/uL (ref 0.1–1.0)
Monocytes Relative: 9.7 % (ref 3.0–12.0)
Neutro Abs: 4.6 10*3/uL (ref 1.4–7.7)
Neutrophils Relative %: 68.3 % (ref 43.0–77.0)
Platelets: 266 10*3/uL (ref 150.0–400.0)
RBC: 5.45 Mil/uL (ref 4.22–5.81)
RDW: 13.6 % (ref 11.5–15.5)
WBC: 6.7 10*3/uL (ref 4.0–10.5)

## 2023-04-18 LAB — COMPREHENSIVE METABOLIC PANEL
ALT: 45 U/L (ref 0–53)
AST: 71 U/L — ABNORMAL HIGH (ref 0–37)
Albumin: 4.4 g/dL (ref 3.5–5.2)
Alkaline Phosphatase: 83 U/L (ref 39–117)
BUN: 7 mg/dL (ref 6–23)
CO2: 27 mEq/L (ref 19–32)
Calcium: 9.7 mg/dL (ref 8.4–10.5)
Chloride: 102 mEq/L (ref 96–112)
Creatinine, Ser: 1.02 mg/dL (ref 0.40–1.50)
GFR: 85.54 mL/min (ref >60.00)
Glucose, Bld: 94 mg/dL (ref 70–99)
Potassium: 5.2 mEq/L — ABNORMAL HIGH (ref 3.5–5.1)
Sodium: 138 mEq/L (ref 135–145)
Total Bilirubin: 0.6 mg/dL (ref 0.2–1.2)
Total Protein: 7.9 g/dL (ref 6.0–8.3)

## 2023-04-18 LAB — LIPID PANEL
Cholesterol: 205 mg/dL — ABNORMAL HIGH (ref 0–200)
HDL: 110.8 mg/dL (ref >39.00)
LDL Cholesterol: 77 mg/dL (ref 0–99)
NonHDL: 94.31
Total CHOL/HDL Ratio: 2
Triglycerides: 87 mg/dL (ref 0.0–149.0)
VLDL: 17.4 mg/dL (ref 0.0–40.0)

## 2023-04-18 LAB — TSH: TSH: 0.48 u[IU]/mL (ref 0.35–5.50)

## 2023-04-18 NOTE — Progress Notes (Unsigned)
[facility]: [phone]   -- Annual Preventive Medical Office Visit --  Patient:  Alex Maldonado      Age: 52 y.o.       Sex:  male  Date:   04/18/2023 Patient Care Team: Lula Olszewski, MD as PCP - General (Internal Medicine) Today's Healthcare Provider: Lula Olszewski, MD  Chief Complaint:  Alex Maldonado is a 52 y.o. assigned male at birth who presents today for his Annual Exam and Letter for School/Work (Requesting a letter to not be on call for work due to the CPAP machine that he uses at night. )   Assessment/Plan:   Colon cancer screening -     Cologuard  Tobacco abuse -     TSH -     Comprehensive metabolic panel -     CBC with Differential/Platelet  Alcohol use Assessment & Plan: Encouraged patient to to reduce alcohol use   Other hyperlipidemia -     Lipid panel  Encounters for administrative purposes  Encounter for annual general medical examination with abnormal findings in adult  Today's Health Maintenance Counseling and Anticipatory Guidance:  Eye exams:  every 1-2 years recommended.  Having vision corrected can improve the quality of day-to-day life.  Eye specialists can detect certain eye conditions such as cataracts, glaucoma and age-related macular degeneration, which could lead to sight loss.  They can also detect certain rare cancers and diabetes, among other things.  Alex Maldonado reports his last eye exam was:  not doing Dental health: Discussed importance of regular tooth brushing, flossing, and dental visits q6 months.  Poor dentition can lead to serious medical problems - particularly problems with heart valves.  Alex Maldonado reports he has been keeping up with his dental visits.  He intends to do so in the future.  Sinus health: Encourage sterile saline nasal misting sinus rinses daily for pollen, to reduce allergies and risk for sinus infections.   Sterile can based misting products are recommended due to superior  misting and ease of maintaining sterility. Sleep Apnea screening:  He  denies any significant problems with sleep quality or hypersomnolence or being advised that he has been told that he snores or has apnea STOP-Bang Score (scored NO unless checked) []  Do you snore loudly []  Tired, fatigued, or sleepy during the daytime [x]  Witness apneas []  Significant hypertension []  BMI greater than 35    []  Age older than 51 years old []  Has large neck size over 15.7 in [x]  Male Total Score: known obstructive sleep apnea on cpap  Cardiovascular Risk Factor Reduction:   Advised patient of need for regular exercise and diet rich and fruits and vegetables and healthy fats to reduce risk of heart attack and stroke.  Avoid first- and second-hand smoke and stimulants.   Avoid extreme exercise- exercise in moderation (150 minutes per week is a good goal) Wt Readings from Last 3 Encounters:  04/18/23 172 lb 12.8 oz (78.4 kg)  04/05/22 171 lb 9.6 oz (77.8 kg)  07/11/19 175 lb (79.4 kg)  Body mass index is 22.8 kg/m. He reports his diet consists of aint good, no sweets, [place] [employer]jangles He reports his exercise includes of none. Health maintenance and immunizations reviewed and he was encouraged to complete anything that is due: Immunization History  Administered Date(s) Administered  . Influenza Inj Mdck Quad Pf 06/03/2018  . Pneumococcal Polysaccharide-23 03/03/2018   Health Maintenance Due  Topic Date Due  . Colonoscopy  Never done  Sexual transmitted infection screening: testing offered today, but patient declined as he feels he is low risk based on his sexual history.  Substance use:  I discussed that my recommendation is total abstinence from all substances of abuse including smoke and 2nd hand smoke, alcohol, illicit drugs, smoking, inhalants, sugar.   Offered to assist with any use disorders or addictions.   Injury prevention: Discussed safety belts, safety helmets, smoke detectors. Cancer  Screening: Penile/Testicle/Scrotum cancer screening: Asked the patient about genital warts or tumors/abnormalities of penis/testicles/scrotum, encouraged patient to to inform me of any. Patient reports there are none. Thyroid cancer screening: patient advised to check by palpating thyroid for nodules Prostate cancer screening:  Denies family history of prostate cancer or hematospermia so too young for screening by current guidelines.( Lab Results  Component Value Date   PSA 0.25 04/05/2022  Colon cancer screening:   Denies strong family history of colon cancer or blood in stool so no screening is indicated until age 58.   Lung cancer screening:  Current guidelines recommend Individuals aged 19 to 64 who currently smoke or formerly smoked and have a = 20 pack-year smoking history should undergo annual screening with low-dose computed tomography (LDCT). Skin cancer screening: Advised regular sunscreen use. He denies worrisome, changing, or new skin lesions. Showed him pictures of melanomas for reference:   Return to care in 1 year for next preventative visit.     Subjective:   52 y.o. 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 His reasons/main concerns/chief complaints for today's office visit are Annual Exam and Letter for School/Work (Requesting a letter to not be on call for work due to the CPAP machine that he uses at night. )   He has no acute complaints that they want addressed today. He requests that this visit be treated as his annual physical  / preventive exam. ------------------------------------------------------------------------------------------------------------------------ AI-Extracted: Discussed the use of AI scribe software for clinical note transcription with the patient, who gave verbal consent to proceed.  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 He reports a history of tobacco and alcohol use, with a daily consumption of five to seven beers and a smoking habit that has spanned approximately 10 years. He acknowledges the need to quit both substances, citing alcohol as a trigger for smoking. He has previously attempted cessation with prescribed medication but found it ineffective and has expressed interest in trying nicotine patches or gums.  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 He has been advised to use sinus rinses to mitigate the effects of these exposures. He has been experiencing a stuffy nose and reduced sense of smell, which he initially attributed to a cold. He has been using a CPAP machine for sleep apnea, diagnosed after a sleep study revealed he stopped breathing 17 times in an hour.  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 He reports persistent pain in his elbow  and shoulder, which occasionally impedes his ability to perform tasks at work, such as Engineer, petroleum covers. He has ceased this second job for about two years but continues to experience residual pain. He has been managing this with light workouts using 10 and 15-pound dumbbells at home.  Alex Maldonado diet primarily consists of fast food, although he does not consume sweets. He acknowledges the need for dietary improvements and expresses interest in incorporating more salads  into his meals. He is not currently engaged in any regular exercise outside of his physically demanding job but has expressed a desire to return to the gym.  He has a history of dental issues, with multiple missing upper left teeth and a large cavity in a lower left wisdom tooth. He has not been regularly attending dental check-ups or cleanings. He also reports a previous injury to his foot from an accident at work, which did not penetrate deeply. He has not had a tetanus shot since 2019.     ------------------------------------------------------------------------------------------------------------------------------------------------------------------------------- ROS  The Review of Systems (ROS) conducted during this wellness visit may not comprehensively reflect the patient's current health status due to the following considerations:  Prior to the ROS, patients are advised that any urgent medical issues identified during the wellness visit may necessitate immediate attention, potentially resulting in a separate billable encounter beyond the scope of the preventive exam. This disclosure is mandated by professional ethics and legal obligations, as healthcare providers must address any significant health concerns raised during any patient interaction. The current structure of annual preventive exams may inadvertently create a disincentive for patients to fully disclose health concerns during the ROS, due to potential financial implications.   Patients typically express that any positive findings in the review of systems at wellness visits are related to chronic conditions that are stable and request that these issues not be addressed in this visit, with the understanding that they are part of ongoing management and do not require additional workup at this time. As a result, the ROS collected during wellness visits may not always provide a complete picture of the patient's health status, as patients might  withhold information about emerging health issues to avoid additional charges.  This note serves to acknowledge the potential limitations in the ROS data collected during wellness visits and to ensure transparency in our medical documentation and billing practices. Problem list overviews that were updated at today's visit: Problem  Alcohol Use  Tobacco Abuse   Started age 63ish, slowly increased to 0.5 ppd dropped down to 0.25 ppd, then when brother died Apr 20, 2022 Desire to quit 04-19-2022 due to kids, losing a brother to smoking related dz, parents up and age and he wants to be around Was avoidant of wellbutrin due to side effects.  Triggers include drinking, bad news, meals Weight lifting helps manage triggers and praying- I told him a little about mindfulness.    I attest that I have reviewed and confirmed the patients current medications to meet the medication reconciliation requirement *** Current Outpatient Medications on File Prior to Visit  Medication Sig  . Multiple Vitamin (MULTIVITAMIN WITH MINERALS) TABS tablet Take 1 tablet by mouth daily.  Marland Kitchen buPROPion (WELLBUTRIN XL) 150 MG 24 hr tablet Take 1 tablet (150 mg total) by mouth daily. (Patient not taking: Reported on 04/18/2023)  . naltrexone (DEPADE) 50 MG tablet Take 1 tablet (50 mg total) by mouth daily. (Patient not taking: Reported on 04/18/2023)  . nicotine polacrilex (NICORETTE) 4 MG gum Take 1 each (4  mg total) by mouth as needed for smoking cessation (for nicotine replacement.). (Patient not taking: Reported on 04/18/2023)   No current facility-administered medications on file prior to visit.  There are no discontinued medications. The following were reviewed and entered/updated into our electronic MEDICAL RECORD NUMBER   04/18/2023    9:15 AM  Depression screen PHQ 2/9  Decreased Interest 2  Down, Depressed, Hopeless 1  PHQ - 2 Score 3  Altered sleeping 2  Tired, decreased energy 1  Change in appetite 1  Feeling bad or failure  about yourself  0  Trouble concentrating 1  Moving slowly or fidgety/restless 0  Suicidal thoughts 0  PHQ-9 Score 8  Difficult doing work/chores Not difficult at all   Past Medical History:  Diagnosis Date  . Asthma   . Cannabis abuse, episodic 04/10/22   Advised that I dont think this is medical, so I dont promote it, but also I think that as a recreational drug its much less of a medical concern than tobacco and alcohol.  Encouraged him to eliminate tobacco and alcohol preferentially.  . Chronic cough 04-10-22   Probably from smoking  . COPD (chronic obstructive pulmonary disease) (HCC)   . 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 Family history of aneurysm 2022-04-10   Brother died 2021ish suddently of aneurysm.   Marland Kitchen Heart murmur   . History of pneumonia 2022/04/10  . Hypertension   . OSA (obstructive sleep apnea) 04/10/22   cpap setting at 16  . Prostate cancer screening information given during patient encounter 04/10/2022  . Right upper lobe pneumonia 08/15/2017  . Sleep apnea    Patient Active Problem List   Diagnosis Date Noted  . Alcohol use 04/19/2022  . 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 Chronic cough 2022-04-10  . History of pneumonia 04/10/2022  . Prostate cancer (HCC) 04/10/22  . Family history of aneurysm 04-10-22  . Prostate cancer screening information given during patient encounter 2022/04/10  . Colon cancer screening 2022/04/10  . OSA (obstructive sleep apnea) 10-Apr-2022  . COVID-19 vaccine dose declined 04/10/2022  . Cannabis abuse, episodic 10-Apr-2022  . Allergic rhinitis 12/10/2018  . COPD with chronic bronchitis and emphysema (HCC) 11/05/2017  . Tobacco abuse   . Heart murmur 08/15/2017  . Malnutrition of moderate degree 08/15/2017   Past Surgical History:  Procedure Laterality Date  . CHEST TUBE INSERTION     early  90s with pneumonia   History reviewed. No pertinent family history. No Known Allergies Social History   Tobacco Use  . Smoking status: Every Day    Current packs/day: 0.30    Average packs/day: 0.3 packs/day for 10.0 years (3.0 ttl pk-yrs)    Types: Cigarettes  . Smokeless tobacco: Never  . Tobacco comments:    quit 07/14/17  Substance Use Topics  . Alcohol use: Yes    Alcohol/week: 35.0 standard drinks of alcohol    Types: 35 Cans of beer per week    Comment: beer  . Drug use: Yes    Types: Marijuana        Objective:  Physical Exam: BP 137/84 (BP Location: Right Arm, Patient Position: Sitting)   Pulse 78   Temp 98 F (36.7 C) (Temporal)   Ht 6\' 1"  (1.854 m)   Wt 172 lb 12.8 oz (78.4 kg)   SpO2 98%  BMI 22.80 kg/m   Body mass index is 22.8 kg/m. Wt Readings from Last 3 Encounters:  04/18/23 172 lb 12.8 oz (78.4 kg)  04/05/22 171 lb 9.6 oz (77.8 kg)  07/11/19 175 lb (79.4 kg)   GEN: NAD, resting comfortably  HEENT: Tympanic membranes normal appearing bilaterally***, Oropharynx clear. No Thyromegaly noted. No palpable lymphadenopathy or thyroid nodules. CARDIOVASCULAR: S1 and S2 heart sounds have regular rate and rhythm with no murmurs appreciated PULMONARY:  Normal work of breathing. Clear to auscultation bilaterally with no crackles, wheezes, or rhonchi ABDOMEN: Soft, Nontender, Nondistended.  MSK: No edema, cyanosis, or clubbing noted  SKIN: Warm, dry, no lesions of concern observed NEURO: CN2-12 grossly intact. Strength 5/5 in upper and lower extremities. Reflexes symmetric and intact bilaterally.  PSYCH: Normal affect and thought content, pleasant and cooperative.

## 2023-04-18 NOTE — Assessment & Plan Note (Signed)
Encouraged patient to to reduce alcohol use

## 2023-04-19 DIAGNOSIS — Z579 Occupational exposure to unspecified risk factor: Secondary | ICD-10-CM | POA: Insufficient documentation

## 2023-04-19 DIAGNOSIS — E7849 Other hyperlipidemia: Secondary | ICD-10-CM | POA: Insufficient documentation

## 2023-04-19 DIAGNOSIS — M7918 Myalgia, other site: Secondary | ICD-10-CM | POA: Insufficient documentation

## 2023-04-19 NOTE — Assessment & Plan Note (Signed)
He is using a CPAP machine set at 15 but report a stuffy nose and decreased sense of smell, possibly from occupational exposure. We encouraged daily sinus rinses to improve nasal congestion and CPAP efficacy. We also suggested an over-the-counter COVID-19 test due to the decreased sense of smell.

## 2023-04-19 NOTE — Patient Instructions (Signed)
VISIT SUMMARY:  Dear [name], thank you for coming in for your annual wellness visit. We discussed several important aspects of your health, including your sleep apnea, tobacco and alcohol use, occupational exposure, and musculoskeletal pain. We also talked about the importance of general health maintenance, including regular eye and dental check-ups, exercise, and a healthy diet.  YOUR PLAN:  -SLEEP APNEA: Sleep apnea is a condition where you stop breathing during sleep. We discussed the importance of using your CPAP machine and daily sinus rinses to improve nasal congestion and the machine's effectiveness. We also suggested getting an over-the-counter COVID-19 test due to your decreased sense of smell.  -TOBACCO USE: Smoking can lead to serious health problems. We discussed the importance of quitting smoking and considering alternative nicotine products like pouches, patches, or gums. We also suggested a lung cancer screening due to your smoking history.  -ALCOHOL USE: Excessive alcohol consumption can harm your health. We discussed reducing your alcohol intake and considering attending Alcoholics Anonymous meetings.  -OCCUPATIONAL EXPOSURE: Exposure to pollutants at work can affect your health. We discussed the importance of daily sinus rinses to reduce respiratory exposure and using an N95 mask for protection during work.  -MUSCULOSKELETAL PAIN: Chronic pain in your elbow and shoulder can affect your daily activities. We suggested over-the-counter anti-inflammatory medications as needed and considering a referral to sports medicine or physical therapy if the pain persists.  -GENERAL HEALTH MAINTENANCE: Regular eye and dental check-ups, exercise, and a healthy diet are important for your overall health. We ordered basic lab work and advised a return for follow-up in one year or sooner if any health issues arise.  INSTRUCTIONS:  Please remember to use your CPAP machine and perform daily  sinus rinses. Consider alternative nicotine products and reduce your alcohol intake. Protect yourself at work with an N95 mask. Use over-the-counter anti-inflammatory medications for your pain as needed. Attend regular eye and dental check-ups, exercise regularly, and maintain a healthy diet. We have ordered basic lab work for you. Please return for a follow-up in one year or sooner if any health issues arise.  Managing Your Health Over Time  Managing every aspect of your health in a single visit isn't always feasible, but that's okay.  We addressed many preventive issues today and charted a course for future care. Acute conditions or preventive care measures may require further attention.  We encourage you to schedule a follow-up visit at your earliest convenience to discuss any unresolved issues.  We strongly encourage continued participation in annual preventive care visits to help [place] develop a more thorough understanding of your health and to help you maintain optimal wellness - please inquire about scheduling your next one with [place] at your earliest convenience.  Next Steps  [x]   Schedule Follow-Up:  We recommend a follow-up appointment in 1 year for your next wellness visit.  If you develop any new problems, want to address any medical issues, or your condition worsens before then, please call us for an appointment or seek emergency care. [x]   Preventive Care:  Make sure to keep regular appointments with dental and vision professionals, use nightly nasal saline mist sprays to keep your sinuses clear and toothbrushing to protect your teeth. Use [employer] App or other app to track your sleep quality. Check blood pressure and heart rate routinely. [x]   Medical Information Release:  For any relevant medical information we don't have, please sign a release form at the front desk so we can obtain it for your  records. [x]   Lab Tests:  Schedule any lab tests from today for within a week to ensure best  insurance coverage.    Making the Most of Our Focused (20 minute) Appointments:  [x]   Clearly state your top concerns at the beginning of the visit to focus our discussion [x]   If you anticipate you will need more time, please inform the front desk during scheduling - we can book multiple appointments in the same week. [x]   If you have transportation problems- use our convenient video appointments or ask about transportation support. [x]   We can get down to business faster if you use MyChart to update information before the visit and submit non-urgent questions before your visit. Thank you for taking the time to provide details through MyChart.  Let our nurse know and she can import this information into your encounter documents.  Arrival and Wait Times: [x]   Arriving on time ensures that everyone receives prompt attention. [x]   Early morning (8a) and afternoon (1p) appointments tend to have shortest wait times. [x]   Unfortunately, we cannot delay appointments for late arrivals or hold slots during phone calls.  Bring to Your Next Appointment  [x]   Medications: Please bring all your medication bottles to your next appointment to ensure we have an accurate record of your prescriptions. [x]   Health Diaries: If you're monitoring any health conditions at home, keeping a diary of your readings can be very helpful for discussions at your next appointment.  Reviewing Your Records  [x]   Review your attached preventive care information at the end of these patient instructions. [x]   Review this early draft of your clinical encounter notes below and the final encounter summary tomorrow on MyChart after its been completed.   Encounter for annual general medical examination with abnormal findings in adult  Colon cancer screening -     Cologuard  Tobacco abuse -     TSH -     Comprehensive metabolic panel -     CBC with Differential/Platelet  Alcohol use Assessment & Plan: Encouraged patient to  to reduce alcohol use  Orders: -     TSH -     Comprehensive metabolic panel -     CBC with Differential/Platelet  Other hyperlipidemia -     Lipid panel  Encounters for administrative purposes  OSA (obstructive sleep apnea)  Occupational exposure in workplace  Musculoskeletal pain     Getting Answers and Following Up  [x]   Simple Questions & Concerns: For quick questions or basic follow-up after your visit, reach [place] at [phone] or MyChart messaging. [x]   Complex Concerns: If your concern is more complex, scheduling an appointment might be best. Discuss this with the staff to find the most suitable option. [x]   Lab & Imaging Results: We'll contact you directly if results are abnormal or you don't use MyChart. Most normal results will be on MyChart within 2-3 business days, with a review message from Dr. Jon Billings. Haven't heard back in 2 weeks? Need results sooner? Contact us at [phone]. [x]   Referrals: Our referral coordinator will manage specialist referrals. The specialist's office should contact you within 2 weeks to schedule an appointment. Call us if you haven't heard from them after 2 weeks.  Staying Connected  [x]   MyChart: Activate your MyChart for the fastest way to access results and message [place]. See the last page of this paperwork for instructions on how to activate.  Billing  [x]   X-ray & Lab Orders: These are  billed by separate companies. Contact the invoicing company directly for questions or concerns. [x]   Visit Charges: Discuss any billing inquiries with our administrative services team.  Your Satisfaction Matters  [x]   Share Your Experience: We strive for your satisfaction! If you have any complaints, or preferably compliments, please let Dr. Jon Billings know directly or contact our Practice Administrators, Edwena Felty or [employer], by asking at the front desk.

## 2023-04-19 NOTE — Assessment & Plan Note (Signed)
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 We suggested over-the-counter anti-inflammatory medications as needed and considering a referral to sports medicine or physical therapy if the pain persists.

## 2023-04-19 NOTE — Assessment & Plan Note (Signed)
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 We encouraged daily sinus rinses to reduce respiratory exposure and the use of an N95 mask for protection during work.

## 2023-04-19 NOTE — Assessment & Plan Note (Signed)
He has smoked for 30 years, about a quarter pack a day. We encouraged quitting smoking and considering alternative nicotine products like nicotine pouches, patches, or gums. We also suggested a lung cancer screening due to his smoking history.

## 2023-04-30 ENCOUNTER — Other Ambulatory Visit: Payer: Self-pay | Admitting: Internal Medicine

## 2023-04-30 DIAGNOSIS — Z720 Tobacco use: Secondary | ICD-10-CM

## 2023-05-30 ENCOUNTER — Other Ambulatory Visit: Payer: Self-pay | Admitting: Internal Medicine

## 2023-05-30 DIAGNOSIS — Z720 Tobacco use: Secondary | ICD-10-CM

## 2023-09-30 NOTE — Telephone Encounter (Signed)
 Copied from CRM [id number]. Topic: General - Other >> Sep 30, 2023  1:27 PM Thersia C wrote: Reason for CRM: Patient called in stating that Dr.Morrison wrote a letter to be off call, but now what's to know what he needs to do in order to be put back on call.    Called patient back and clarified for him concerning the letter.

## 2023-12-08 ENCOUNTER — Ambulatory Visit: Payer: Self-pay

## 2023-12-08 ENCOUNTER — Emergency Department (HOSPITAL_COMMUNITY)

## 2023-12-08 ENCOUNTER — Emergency Department (HOSPITAL_COMMUNITY)
Admission: EM | Admit: 2023-12-08 | Discharge: 2023-12-09 | Disposition: A | Discharge status: Home / Self Care | Attending: Emergency Medicine | Admitting: Emergency Medicine

## 2023-12-08 DIAGNOSIS — J181 Lobar pneumonia, unspecified organism: Secondary | ICD-10-CM | POA: Diagnosis not present

## 2023-12-08 DIAGNOSIS — R10A0 Flank pain, unspecified side: Secondary | ICD-10-CM

## 2023-12-08 DIAGNOSIS — J189 Pneumonia, unspecified organism: Secondary | ICD-10-CM

## 2023-12-08 DIAGNOSIS — R109 Unspecified abdominal pain: Secondary | ICD-10-CM | POA: Diagnosis present

## 2023-12-08 DIAGNOSIS — D72829 Elevated white blood cell count, unspecified: Secondary | ICD-10-CM | POA: Insufficient documentation

## 2023-12-08 LAB — CBC
HCT: 47.1 % (ref 39.0–52.0)
Hemoglobin: 15.9 g/dL (ref 13.0–17.0)
MCH: 29.8 pg (ref 26.0–34.0)
MCHC: 33.8 g/dL (ref 30.0–36.0)
MCV: 88.4 fL (ref 80.0–100.0)
Platelets: 284 10*3/uL (ref 150–400)
RBC: 5.33 MIL/uL (ref 4.22–5.81)
RDW: 12.7 % (ref 11.5–15.5)
WBC: 13.4 10*3/uL — ABNORMAL HIGH (ref 4.0–10.5)
nRBC: 0 % (ref 0.0–0.2)

## 2023-12-08 LAB — COMPREHENSIVE METABOLIC PANEL WITH GFR
ALT: 33 U/L (ref 0–44)
AST: 42 U/L — ABNORMAL HIGH (ref 15–41)
Albumin: 4.2 g/dL (ref 3.5–5.0)
Alkaline Phosphatase: 77 U/L (ref 38–126)
Anion gap: 10 (ref 5–15)
BUN: 13 mg/dL (ref 6–20)
CO2: 23 mmol/L (ref 22–32)
Calcium: 9.2 mg/dL (ref 8.9–10.3)
Chloride: 103 mmol/L (ref 98–111)
Creatinine, Ser: 1.12 mg/dL (ref 0.61–1.24)
GFR, Estimated: >60 mL/min (ref >60)
Glucose, Bld: 81 mg/dL (ref 70–99)
Potassium: 3.6 mmol/L (ref 3.5–5.1)
Sodium: 136 mmol/L (ref 135–145)
Total Bilirubin: 0.8 mg/dL (ref 0.0–1.2)
Total Protein: 8 g/dL (ref 6.5–8.1)

## 2023-12-08 LAB — LIPASE, BLOOD: Lipase: 31 U/L (ref 11–51)

## 2023-12-08 NOTE — ED Provider Triage Note (Signed)
 Emergency Medicine Provider Triage Evaluation Note  Alex Maldonado , a 53 y.o. male  was evaluated in triage.  Pt complains of abdominal pain.  Patient reports pain that began at his epigastrium 3 days ago after drinking many beers.  States that the pain got better on Saturday but then got worse again on Sunday.  Since then he has been having pain at the upper right quadrant.  Denies any associated fevers, nausea, vomiting, or diarrhea.  The severity of pain waxes and wanes.  Symptoms may be due to alcohol use.  Review of Systems  Positive: Abdominal pain Negative: Nausea, vomiting, diarrhea, dysuria  Physical Exam  BP (!) 134/116 (BP Location: Left Arm)   Pulse (!) 108   Temp 98.5 F (36.9 C) (Oral)   Resp 20   Ht 6' (1.829 m)   Wt 78 kg   SpO2 94%   BMI 23.33 kg/m  Gen:   Awake, no distress   Resp:  Normal effort  MSK:   Moves extremities without difficulty  Other:    Medical Decision Making  Medically screening exam initiated at 8:18 PM.  Appropriate orders placed.  Josiephine Nightingale was informed that the remainder of the evaluation will be completed by another provider, this initial triage assessment does not replace that evaluation, and the importance of remaining in the ED until their evaluation is complete.   Sonnie Dusky, PA-C 12/08/23 2023

## 2023-12-08 NOTE — Telephone Encounter (Signed)
 Copied from CRM [id number]. Topic: Clinical - Red Word Triage >> Dec 08, 2023  4:43 PM Shelah Lewandowsky wrote: Red Word that prompted transfer to Nurse Triage: left side pain - shortness of breath- rate level over 10  Chief Complaint: Abdominal pain Symptoms: SOB Frequency: Since Friday Pertinent Negatives: Patient denies relief Disposition: [x] ED /[] Urgent Care (no appt availability in office) / [] Appointment(In office/virtual)/ []  [facility]/ [] Home Care/ [] Refused Recommended Disposition /[] [facility]/ []  Follow-up with PCP Additional Notes: Patient called in to report severe abdominal pain. Patient stated pain is located in left side of abdomin and radiates towards his back. Patient stated he is also experiencing difficulty breathing. Patient able to speak in phrases while on the phone with this RN. Breathing sounded labored. Advised ED at this time. Patient complied and stated he would go.  Reason for Disposition  [1] SEVERE pain (e.g., excruciating) AND [2] present > 1 hour  Answer Assessment - Initial Assessment Questions 1. LOCATION: "Where does it hurt?"      Left side of abdomin and back 2. RADIATION: "Does the pain shoot anywhere else?" (e.g., chest, back)     States pain is radiating towards back  3. ONSET: "When did the pain begin?" (Minutes, hours or days ago)      Friday  4. SUDDEN: "Gradual or sudden onset?"     Gradual 5. PATTERN "Does the pain come and go, or is it constant?"    - If it comes and goes: "How long does it last?" "Do you have pain now?"     (Note: Comes and goes means the pain is intermittent. It goes away completely between bouts.)    - If constant: "Is it getting better, staying the same, or getting worse?"      (Note: Constant means the pain never goes away completely; most serious pain is constant and gets worse.)      Constant 6. SEVERITY: "How bad is the pain?"  (e.g., Scale 1-10; mild, moderate, or severe)    - MILD (1-3): Doesn't  interfere with normal activities, abdomen soft and not tender to touch.     - MODERATE (4-7): Interferes with normal activities or awakens from sleep, abdomen tender to touch.     - SEVERE (8-10): Excruciating pain, doubled over, unable to do any normal activities.       Rates pain a 12/10, states there is a "sharp" pain in his left side 7. RECURRENT SYMPTOM: "Have you ever had this type of stomach pain before?" If Yes, ask: "When was the last time?" and "What happened that time?"      Yes, a few years ago  8. CAUSE: "What do you think is causing the stomach pain?"     Unknown 9. RELIEVING/AGGRAVATING FACTORS: "What makes it better or worse?" (e.g., antacids, bending or twisting motion, bowel movement)     States laying down helps 10. OTHER SYMPTOMS: "Do you have any other symptoms?" (e.g., back pain, diarrhea, fever, urination pain, vomiting)       States pain is causing SOB, breathing sounds labored while on the phone with this RN, states he cannot take a deep breath  Protocols used: Abdominal Pain - Male-A-AH

## 2023-12-08 NOTE — ED Triage Notes (Signed)
 POV, right flank pain, feeling blowed that started Friday. Worsening over the weekend.Pain 7 out of 10   Pt AOx4, right flank tender to palpation.

## 2023-12-09 ENCOUNTER — Encounter (HOSPITAL_COMMUNITY): Payer: Self-pay

## 2023-12-09 ENCOUNTER — Emergency Department (HOSPITAL_COMMUNITY)

## 2023-12-09 LAB — URINALYSIS, ROUTINE W REFLEX MICROSCOPIC
Bilirubin Urine: NEGATIVE
Glucose, UA: NEGATIVE mg/dL
Hgb urine dipstick: NEGATIVE
Ketones, ur: NEGATIVE mg/dL
Nitrite: NEGATIVE
Protein, ur: NEGATIVE mg/dL
Specific Gravity, Urine: 1.019 (ref 1.005–1.030)
pH: 5 (ref 5.0–8.0)

## 2023-12-09 MED ORDER — AMOXICILLIN-POT CLAVULANATE 875-125 MG PO TABS
1.0000 | ORAL_TABLET | Freq: Once | ORAL | Status: AC
  Administered 2023-12-09: 1 via ORAL
  Filled 2023-12-09: qty 1

## 2023-12-09 MED ORDER — LIDOCAINE 5 % EX PTCH
1.0000 | MEDICATED_PATCH | CUTANEOUS | Status: DC
  Administered 2023-12-09: 1 via TRANSDERMAL
  Filled 2023-12-09: qty 1

## 2023-12-09 MED ORDER — AZITHROMYCIN 250 MG PO TABS: 250.0000 mg | ORAL_TABLET | Freq: Every day | ORAL | 0 refills | Status: DC

## 2023-12-09 MED ORDER — SODIUM CHLORIDE (PF) 0.9 % IJ SOLN
INTRAMUSCULAR | Status: AC
  Filled 2023-12-09: qty 50

## 2023-12-09 MED ORDER — IOHEXOL 350 MG/ML SOLN
100.0000 mL | Freq: Once | INTRAVENOUS | Status: AC | PRN
  Administered 2023-12-09: 100 mL via INTRAVENOUS

## 2023-12-09 MED ORDER — FENTANYL CITRATE PF 50 MCG/ML IJ SOSY
50.0000 ug | PREFILLED_SYRINGE | Freq: Once | INTRAMUSCULAR | Status: AC
  Administered 2023-12-09: 50 ug via INTRAVENOUS
  Filled 2023-12-09: qty 1

## 2023-12-09 MED ORDER — AMOXICILLIN-POT CLAVULANATE 875-125 MG PO TABS: 1.0000 | ORAL_TABLET | Freq: Two times a day (BID) | ORAL | 0 refills | Status: DC

## 2023-12-09 NOTE — Discharge Instructions (Addendum)
 You had a CT scan performed in the Emergency Department today that showed changes to your pulmonary artery.  Please follow up closely with your family doctor for further evaluation.

## 2023-12-09 NOTE — ED Provider Notes (Signed)
 [place] EMERGENCY DEPARTMENT AT [facility] Provider Note   CSN: [id number] Arrival date & time: 12/08/23  1906     History  Chief Complaint  Patient presents with   Flank Pain    Alex Maldonado is a 53 y.o. male.  The history is provided by the patient.  Flank Pain  Alex Maldonado is a 53 y.o. male who presents to the Emergency Department complaining of flank pain.  He presents to the emergency department for evaluation of right flank pain that started on Friday.  Pain does spread to his low back.  He did drink some beer on Saturday and his pain significantly worsened.  He does not have chest pain but his pain does worse on breathing.  He does feel short of breath.  No cough, fever, vomiting, diarrhea, nausea, dysuria, leg swelling or pain.  He does have a remote history of empyema and has a known heart murmur. No injuries.   Home Medications Prior to Admission medications   Medication Sig Start Date End Date Taking? Authorizing Provider  amoxicillin-clavulanate (AUGMENTIN) 875-125 MG tablet Take 1 tablet by mouth every 12 (twelve) hours. 12/09/23  Yes Tilden Fossa, MD  azithromycin (ZITHROMAX) 250 MG tablet Take 1 tablet (250 mg total) by mouth daily. Take first 2 tablets together, then 1 every day until finished. 12/09/23  Yes Tilden Fossa, MD  buPROPion (WELLBUTRIN XL) 150 MG 24 hr tablet TAKE 1 TABLET BY MOUTH EVERY DAY 05/30/23   Lula Olszewski, MD  Multiple Vitamin (MULTIVITAMIN WITH MINERALS) TABS tablet Take 1 tablet by mouth daily.    [provider]      Allergies    Patient has no known allergies.    Review of Systems   Review of Systems  Genitourinary:  Positive for flank pain.  All other systems reviewed and are negative.   Physical Exam Updated Vital Signs BP (!) 169/103   Pulse 94   Temp 98.3 F (36.8 C) (Oral)   Resp 17   Ht 6' (1.829 m)   Wt 78 kg   SpO2 94%   BMI 23.33 kg/m  Physical Exam Vitals and nursing  note reviewed.  Constitutional:      Appearance: He is well-developed.  HENT:     Head: Normocephalic and atraumatic.  Cardiovascular:     Rate and Rhythm: Normal rate and regular rhythm.     Heart sounds: No murmur heard. Pulmonary:     Effort: Pulmonary effort is normal. No respiratory distress.     Comments: Crackles in the bases bilaterally Abdominal:     Palpations: Abdomen is soft.     Tenderness: There is no abdominal tenderness. There is right CVA tenderness. There is no guarding or rebound.  Musculoskeletal:        General: No tenderness.     Comments: 2+ DP pulses bilaterally  Skin:    General: Skin is warm and dry.  Neurological:     Mental Status: He is alert and oriented to person, place, and time.  Psychiatric:        Behavior: Behavior normal.     ED Results / Procedures / Treatments   Labs (all labs ordered are listed, but only abnormal results are displayed) Labs Reviewed  CBC - Abnormal; Notable for the following components:      Result Value   WBC 13.4 (*)    All other components within normal limits  URINALYSIS, ROUTINE W REFLEX MICROSCOPIC - Abnormal; Notable  for the following components:   Leukocytes,Ua TRACE (*)    Bacteria, UA FEW (*)    All other components within normal limits  COMPREHENSIVE METABOLIC PANEL WITH GFR - Abnormal; Notable for the following components:   AST 42 (*)    All other components within normal limits  URINE CULTURE  LIPASE, BLOOD    EKG None  Radiology CT ABDOMEN PELVIS W CONTRAST Result Date: 12/09/2023 CLINICAL DATA:  Acute, severe pancreatitis. Right flank pain, bloated feeling. EXAM: CT ANGIOGRAPHY CHEST CT ABDOMEN AND PELVIS WITH CONTRAST TECHNIQUE: Multidetector CT imaging of the chest was performed using the standard protocol during bolus administration of intravenous contrast. Multiplanar CT image reconstructions and MIPs were obtained to evaluate the vascular anatomy. Multidetector CT imaging of the abdomen and  pelvis was performed using the standard protocol during bolus administration of intravenous contrast. RADIATION DOSE REDUCTION: This exam was performed according to the departmental dose-optimization program which includes automated exposure control, adjustment of the mA and/or kV according to patient size and/or use of iterative reconstruction technique. CONTRAST:  100mL OMNIPAQUE IOHEXOL 350 MG/ML SOLN COMPARISON:  None Available. FINDINGS: CTA CHEST FINDINGS Cardiovascular: Satisfactory opacification of the pulmonary arteries to the segmental level. No evidence of pulmonary embolism. Normal heart size. No pericardial effusion. Enlarged pulmonary arteries, especially the main in left pulmonary arteries, main pulmonary artery measuring up to nearly 5 cm in diameter. Mediastinum/Nodes: No evidence of mass or inflammation. No esophageal thickening Lungs/Pleura: Streaky density in the right lower lung. Generalized airway thickening with emphysema. No edema, effusion, or pneumothorax. Musculoskeletal: No acute finding Review of the MIP images confirms the above findings. CT ABDOMEN and PELVIS FINDINGS Hepatobiliary: Hepatic steatosis.No evidence of biliary obstruction or stone. Pancreas: Unremarkable. Spleen: Unremarkable. Adrenals/Urinary Tract: Negative adrenals. No hydronephrosis or stone. Unremarkable bladder. Stomach/Bowel:  No obstruction. No appendicitis. Vascular/Lymphatic: No acute vascular abnormality. Atheromatous calcification of the aorta and iliacs. No mass or adenopathy. Reproductive:No pathologic findings. Other: No ascites or pneumoperitoneum. Musculoskeletal: No acute abnormalities. Generalized lumbar spine degeneration. Review of the MIP images confirms the above findings. IMPRESSION: Chest CTA: 1. Atelectasis the right lung base, cannot exclude superimposed infection. 2. Dilated main and left pulmonary arteries with unusual appearance of the pulmonary artery outflow tract, question pulmonic valve  stenosis, correlate with echocardiography. 3. Emphysema and airway thickening. Abdominal CT: No acute finding. Atherosclerosis and hepatic steatosis. Electronically Signed   By: Ronnette Coke M.D.   On: 12/09/2023 06:22   CT Angio Chest PE W/Cm &/Or Wo Cm Result Date: 12/09/2023 CLINICAL DATA:  Acute, severe pancreatitis. Right flank pain, bloated feeling. EXAM: CT ANGIOGRAPHY CHEST CT ABDOMEN AND PELVIS WITH CONTRAST TECHNIQUE: Multidetector CT imaging of the chest was performed using the standard protocol during bolus administration of intravenous contrast. Multiplanar CT image reconstructions and MIPs were obtained to evaluate the vascular anatomy. Multidetector CT imaging of the abdomen and pelvis was performed using the standard protocol during bolus administration of intravenous contrast. RADIATION DOSE REDUCTION: This exam was performed according to the departmental dose-optimization program which includes automated exposure control, adjustment of the mA and/or kV according to patient size and/or use of iterative reconstruction technique. CONTRAST:  100mL OMNIPAQUE IOHEXOL 350 MG/ML SOLN COMPARISON:  None Available. FINDINGS: CTA CHEST FINDINGS Cardiovascular: Satisfactory opacification of the pulmonary arteries to the segmental level. No evidence of pulmonary embolism. Normal heart size. No pericardial effusion. Enlarged pulmonary arteries, especially the main in left pulmonary arteries, main pulmonary artery measuring up to nearly 5 cm in diameter.  Mediastinum/Nodes: No evidence of mass or inflammation. No esophageal thickening Lungs/Pleura: Streaky density in the right lower lung. Generalized airway thickening with emphysema. No edema, effusion, or pneumothorax. Musculoskeletal: No acute finding Review of the MIP images confirms the above findings. CT ABDOMEN and PELVIS FINDINGS Hepatobiliary: Hepatic steatosis.No evidence of biliary obstruction or stone. Pancreas: Unremarkable. Spleen: Unremarkable.  Adrenals/Urinary Tract: Negative adrenals. No hydronephrosis or stone. Unremarkable bladder. Stomach/Bowel:  No obstruction. No appendicitis. Vascular/Lymphatic: No acute vascular abnormality. Atheromatous calcification of the aorta and iliacs. No mass or adenopathy. Reproductive:No pathologic findings. Other: No ascites or pneumoperitoneum. Musculoskeletal: No acute abnormalities. Generalized lumbar spine degeneration. Review of the MIP images confirms the above findings. IMPRESSION: Chest CTA: 1. Atelectasis the right lung base, cannot exclude superimposed infection. 2. Dilated main and left pulmonary arteries with unusual appearance of the pulmonary artery outflow tract, question pulmonic valve stenosis, correlate with echocardiography. 3. Emphysema and airway thickening. Abdominal CT: No acute finding. Atherosclerosis and hepatic steatosis. Electronically Signed   By: Tiburcio Pea M.D.   On: 12/09/2023 06:22   DG Chest Port 1 View Result Date: 12/09/2023 CLINICAL DATA:  Shortness of breath and right-sided chest pain EXAM: PORTABLE CHEST 1 VIEW COMPARISON:  03/03/2018 FINDINGS: Cardiac shadow is within normal limits. Prominent pulmonary arteries are seen bilaterally. Right basilar atelectasis is noted. No focal confluent infiltrate is seen. IMPRESSION: Mild right basilar atelectasis. Electronically Signed   By: Alcide Clever M.D.   On: 12/09/2023 03:59   US Abdomen Limited RUQ (LIVER/GB) Result Date: 12/08/2023 CLINICAL DATA:  Right upper quadrant pain x3 days. EXAM: ULTRASOUND ABDOMEN LIMITED RIGHT UPPER QUADRANT COMPARISON:  July 11, 2019 FINDINGS: Gallbladder: No gallstones or wall thickening visualized (1.4 mm). No sonographic Murphy sign noted by sonographer. Common bile duct: Diameter: 2.5 mm Liver: No focal lesion identified. There is diffusely increased echogenicity of the liver parenchyma. Portal vein is patent on color Doppler imaging with normal direction of blood flow towards the liver.  Other: None. IMPRESSION: Hepatic steatosis. Electronically Signed   By: Aram Candela M.D.   On: 12/08/2023 21:42    Procedures Procedures    Medications Ordered in ED Medications  lidocaine (LIDODERM) 5 % 1 patch (1 patch Transdermal Patch Applied 12/09/23 0350)  lidocaine (LIDODERM) 5 % 1 patch (1 patch Transdermal Patch Applied 12/09/23 0711)  fentaNYL (SUBLIMAZE) injection 50 mcg (50 mcg Intravenous Given 12/09/23 0347)  iohexol (OMNIPAQUE) 350 MG/ML injection 100 mL (100 mLs Intravenous Contrast Given 12/09/23 0547)  amoxicillin-clavulanate (AUGMENTIN) 875-125 MG per tablet 1 tablet (1 tablet Oral Given 12/09/23 0710)    ED Course/ Medical Decision Making/ A&P                                 Medical Decision Making Amount and/or Complexity of Data Reviewed Labs: ordered. Radiology: ordered.  Risk Prescription drug management.   Patient here for evaluation of right flank pain since Friday.  He does have local CVA tenderness.  UA is equivocal for UTI, patient does not have active dysuria but does have pain in this area.  Will send a culture.  CBC with mild leukocytosis.  CTA was obtained that is concerning for possible developing pneumonia.  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  Patient breathing comfortably on room air, PSI class II.  Discussed with patient findings of studies.  Feel he is stable for discharge home with oral antibiotics.  Will prescribe Lidoderm for his pain, discussed  avoiding alcohol.  Discussed incidental finding of pulmonary artery changes that will need to be followed up closely with PCP.  Discussed return precautions. Presentation is not consistent with dissection, sepsis.  No evidence of PE.        Final Clinical Impression(s) / ED Diagnoses Final diagnoses:  Community acquired pneumonia of right lower lobe of lung  Flank pain    Rx / DC Orders ED Discharge Orders          Ordered    amoxicillin-clavulanate (AUGMENTIN) 875-125 MG  tablet  Every 12 hours        12/09/23 0644    azithromycin (ZITHROMAX) 250 MG tablet  Daily        12/09/23 0644              Kelsey Patricia, MD 12/09/23 [phone]

## 2023-12-09 NOTE — ED Notes (Signed)
 Patient transported to CT

## 2023-12-09 NOTE — Telephone Encounter (Signed)
 Pt went to ED

## 2023-12-09 NOTE — ED Notes (Signed)
 Pt ambulatory to the restroom.

## 2023-12-09 NOTE — ED Notes (Signed)
 Called lab to add urine culture.

## 2023-12-10 LAB — URINE CULTURE: Culture: NO GROWTH

## 2023-12-19 ENCOUNTER — Ambulatory Visit: Admitting: Internal Medicine

## 2023-12-19 ENCOUNTER — Encounter: Payer: Self-pay | Admitting: Internal Medicine

## 2023-12-19 VITALS — BP 114/70 | HR 84 | Temp 98.8°F | Ht 72.0 in | Wt 175.8 lb

## 2023-12-19 DIAGNOSIS — Z579 Occupational exposure to unspecified risk factor: Secondary | ICD-10-CM

## 2023-12-19 DIAGNOSIS — M25562 Pain in left knee: Secondary | ICD-10-CM

## 2023-12-19 DIAGNOSIS — R109 Unspecified abdominal pain: Secondary | ICD-10-CM

## 2023-12-19 DIAGNOSIS — Q2579 Other congenital malformations of pulmonary artery: Secondary | ICD-10-CM | POA: Diagnosis not present

## 2023-12-19 DIAGNOSIS — F17200 Nicotine dependence, unspecified, uncomplicated: Secondary | ICD-10-CM | POA: Insufficient documentation

## 2023-12-19 DIAGNOSIS — R011 Cardiac murmur, unspecified: Secondary | ICD-10-CM

## 2023-12-19 DIAGNOSIS — K760 Fatty (change of) liver, not elsewhere classified: Secondary | ICD-10-CM

## 2023-12-19 DIAGNOSIS — F17218 Nicotine dependence, cigarettes, with other nicotine-induced disorders: Secondary | ICD-10-CM

## 2023-12-19 DIAGNOSIS — J439 Emphysema, unspecified: Secondary | ICD-10-CM

## 2023-12-19 DIAGNOSIS — J189 Pneumonia, unspecified organism: Secondary | ICD-10-CM

## 2023-12-19 DIAGNOSIS — F1090 Alcohol use, unspecified, uncomplicated: Secondary | ICD-10-CM

## 2023-12-19 DIAGNOSIS — J4489 Other specified chronic obstructive pulmonary disease: Secondary | ICD-10-CM

## 2023-12-19 DIAGNOSIS — G8929 Other chronic pain: Secondary | ICD-10-CM | POA: Insufficient documentation

## 2023-12-19 MED ORDER — MELOXICAM 7.5 MG PO TABS: 7.5000 mg | ORAL_TABLET | Freq: Every day | ORAL | 0 refills | Status: DC

## 2023-12-19 MED ORDER — VARENICLINE TARTRATE (STARTER) 0.5 MG X 11 & 1 MG X 42 PO TBPK: ORAL_TABLET | ORAL | 0 refills | Status: DC

## 2023-12-19 MED ORDER — VARENICLINE TARTRATE 1 MG PO TABS: 1.0000 mg | ORAL_TABLET | Freq: Two times a day (BID) | ORAL | 11 refills | Status: DC

## 2023-12-19 NOTE — Progress Notes (Signed)
 ==============================  [facility]: [phone]   -- Medical Office Visit --  Patient: Alex Maldonado      Age: 53 y.o.       Sex:  male  Date:   12/19/2023 Today's Healthcare Provider: Anthon Kins, MD  ==============================   Chief Complaint: Acute Visit (Patient presents today for a emergency room visit.) and Quality Metric Gaps (Zoster, pneumococcal, colonoscopy)  History of Present Illness 53 year old male who presents for follow-up after an emergency room visit for flank pain and shortness of breath. The patient was evaluated in the ER on 12/08/23 for right-sided flank pain and dyspnea. 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 He was discharged on oral antibiotics (amoxicillin -clavulanate and azithromycin ). The patient reports approximately 70% improvement in flank pain with current antibiotic therapy but continues to experience some shortness of breath. Notably, he did not exhibit typical pneumonia symptoms such as cough or fever during the initial presentation. The patient has a significant pulmonary history. 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 Recently, he was informed of a heart murmur during his emergency room evaluation, though he reports no prior cardiology follow-up in many years. Additionally, the patient reports left knee pain for approximately 9-10 months, which worsens with prolonged standing. This is likely related to his occupational requirements that involve being on his feet for extended periods. 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 He expresses concern that this occupational exposure may have contributed to his recent health issues. Social history is notable for current tobacco use. He  is attempting to quit and acknowledges the difficulty in doing so. He reports alcohol consumption of approximately 35 standard drinks per week. The patient has tried various supplements including turmeric, melanin, black seed oil, and elderberry to strengthen his immune system. He uses a CPAP machine for obstructive sleep apnea but has not had recent pulmonary follow-up. Laboratory findings from the ER visit showed mild leukocytosis (WBC 13.4). Urinalysis revealed trace leukocytes and few bacteria, though urine culture showed no growth. 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 Hepatic steatosis was also noted. ER records were reviewed and findings confirmed with the patient during today's visit.ER visit 12/08/23 stated:  "Patient here for evaluation of right flank pain since Friday.  He does have local CVA tenderness.  UA is equivocal for UTI, patient does not have active dysuria but does have pain in this area.  Will send a culture.  CBC with mild leukocytosis.  CTA was obtained that is concerning for possible developing pneumonia.  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  Patient breathing comfortably on room air, PSI class II.  Discussed with patient findings of studies.  Feel he is stable for discharge home with oral antibiotics.  Will prescribe Lidoderm  for his pain, discussed avoiding alcohol.  Discussed incidental finding of pulmonary artery changes that will need to be followed up closely with PCP.  Discussed return precautions. Presentation is not consistent with dissection, sepsis.  No evidence of PE. 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 Medications:  has a current medication list which includes the following prescription(s): amoxicillin -clavulanate, azithromycin , meloxicam , multivitamin with minerals, varenicline , varenicline  tartrate (starter), and bupropion .  Allergies:  has no known allergies.  Past Medical History:  has a past medical history of Asthma, Cannabis abuse, episodic (04/05/2022), Chronic cough (04/05/2022), COPD (chronic obstructive pulmonary disease) (HCC), Deformity of phalanx of digit of hand, right (04/05/2022), Family history of aneurysm (04/05/2022), Heart murmur, History of pneumonia (04/05/2022), Hypertension, OSA (obstructive sleep apnea) (04/05/2022), Prostate cancer screening information given during patient encounter (04/05/2022), Right upper lobe pneumonia (08/15/2017), and Sleep apnea. Past Surgical History:   has a past surgical history that includes Chest tube insertion. Social History:   reports that he has been smoking cigarettes. He has a 3 pack-year smoking history. He has never used smokeless tobacco. He reports current alcohol use of about 35.0 standard drinks of alcohol per week. He reports current drug use. Drug: Marijuana. Family History:  family history is not on file. Depression Screen and Health Maintenance:    04/18/2023    9:15 AM 04/05/2022    8:09 AM  PHQ 2/9 Scores  PHQ - 2 Score 3 0  PHQ- 9 Score 8    Medication Reconciliation: Current Outpatient Medications on File Prior to Visit   Medication Sig   amoxicillin -clavulanate (AUGMENTIN ) 875-125 MG tablet Take 1 tablet by mouth every 12 (twelve) hours.   azithromycin  (ZITHROMAX ) 250 MG tablet Take 1 tablet (250 mg total) by mouth daily. Take first 2 tablets together, then 1 every day until finished.   Multiple Vitamin (MULTIVITAMIN WITH MINERALS) TABS tablet Take 1 tablet by mouth daily.   buPROPion  (WELLBUTRIN  XL) 150 MG 24 hr tablet TAKE 1 TABLET BY MOUTH EVERY DAY (Patient not taking: Reported on 12/19/2023)   No current facility-administered medications on file prior to visit.  There are no discontinued medications.   Physical Exam:    12/19/2023    1:06 PM 12/09/2023    6:01 AM 12/09/2023    3:20 AM  Vitals with BMI  Height 6\' 0"     Weight 175 lbs 13 oz    BMI 23.84    Systolic 114 169 829  Diastolic 70 103 76  Pulse 84 94 87   Wt Readings from Last 10 Encounters:  12/19/23 175 lb 12.8 oz (79.7 kg)  12/08/23 172 lb (78 kg)  04/18/23 172 lb 12.8 oz (78.4 kg)  04/05/22 171 lb 9.6 oz (77.8 kg)  07/11/19 175 lb (79.4 kg)  10/23/18 184 lb (83.5 kg)  03/03/18 176 lb 3.2 oz (79.9 kg)  11/16/17 175 lb (79.4 kg)  11/03/17 165 lb (74.8 kg)  09/04/17 155 lb (70.3 kg)  Vital signs reviewed.  Nursing notes reviewed. Weight trend reviewed. Physical Exam  Physical Exam General Appearance:  No acute distress appreciable.   Well-groomed, healthy-appearing male.  Well proportioned with no abnormal fat distribution.  Good muscle tone. Pulmonary:  Normal work of breathing at rest, no respiratory distress apparent. SpO2: 97 %  Musculoskeletal: All extremities are intact.  Neurological:  Awake, alert, oriented, and engaged.  No obvious focal neurological deficits or cognitive  impairments.  Sensorium seems unclouded.   Speech is clear and coherent with logical content. Psychiatric:  Appropriate mood, pleasant and cooperative demeanor, thoughtful and engaged during the exam      No results found for any visits on  12/19/23. Admission on 12/08/2023, Discharged on 12/09/2023  Component Date Value   WBC 12/08/2023 13.4 (H)    RBC 12/08/2023 5.33    Hemoglobin 12/08/2023 15.9    HCT 12/08/2023 47.1    MCV 12/08/2023 88.4    MCH 12/08/2023 29.8    MCHC 12/08/2023 33.8    RDW 12/08/2023 12.7    Platelets 12/08/2023 284    nRBC 12/08/2023 0.0    Color, Urine 12/09/2023 YELLOW    APPearance 12/09/2023 CLEAR    Specific Gravity, Urine 12/09/2023 1.019    pH 12/09/2023 5.0    Glucose, UA 12/09/2023 NEGATIVE    Hgb urine dipstick 12/09/2023 NEGATIVE    Bilirubin Urine 12/09/2023 NEGATIVE    Ketones, ur 12/09/2023 NEGATIVE    Protein, ur 12/09/2023 NEGATIVE    Nitrite 12/09/2023 NEGATIVE    Leukocytes,Ua 12/09/2023 TRACE (A)    RBC / HPF 12/09/2023 0-5    WBC, UA 12/09/2023 11-20    Bacteria, UA 12/09/2023 FEW (A)    Squamous Epithelial / HPF 12/09/2023 0-5    Mucus 12/09/2023 PRESENT    Sodium 12/08/2023 136    Potassium 12/08/2023 3.6    Chloride 12/08/2023 103    CO2 12/08/2023 23    Glucose, Bld 12/08/2023 81    BUN 12/08/2023 13    Creatinine, Ser 12/08/2023 1.12    Calcium 12/08/2023 9.2    Total Protein 12/08/2023 8.0    Albumin 12/08/2023 4.2    AST 12/08/2023 42 (H)    ALT 12/08/2023 33    Alkaline Phosphatase 12/08/2023 77    Total Bilirubin 12/08/2023 0.8    GFR, Estimated 12/08/2023 >60    Anion gap 12/08/2023 10    Lipase 12/08/2023 31    Specimen Description 12/09/2023                     Value:URINE, CLEAN CATCH Performed at [facility], [address] [place], [place] [address]    Special Requests 12/09/2023                     Value:NONE Performed at [facility], [address], [place], [place] [address]    Culture 12/09/2023                     Value:NO GROWTH Performed at [facility], [address] [place], [place] [address]    Report Status 12/09/2023 12/10/2023 FINAL   Office Visit on 04/18/2023  Component  Date Value   Cholesterol 04/18/2023 205 (H)    Triglycerides 04/18/2023 87.0    HDL 04/18/2023 110.80    VLDL 04/18/2023 17.4    LDL Cholesterol 04/18/2023 77    Total CHOL/HDL Ratio 04/18/2023 2    NonHDL 04/18/2023 94.31    TSH 04/18/2023 0.48    Sodium 04/18/2023 138    Potassium 04/18/2023 5.2 No hemolysis seen (H)    Chloride 04/18/2023 102    CO2 04/18/2023 27    Glucose, Bld 04/18/2023 94    BUN 04/18/2023 7    Creatinine, Ser 04/18/2023 1.02    Total Bilirubin 04/18/2023 0.6    Alkaline Phosphatase 04/18/2023 83    AST 04/18/2023 71 (H)  ALT 04/18/2023 45    Total Protein 04/18/2023 7.9    Albumin 04/18/2023 4.4    GFR 04/18/2023 85.54    Calcium 04/18/2023 9.7    WBC 04/18/2023 6.7    RBC 04/18/2023 5.45    Hemoglobin 04/18/2023 16.0    HCT 04/18/2023 49.0    MCV 04/18/2023 89.8    MCHC 04/18/2023 32.8    RDW 04/18/2023 13.6    Platelets 04/18/2023 266.0    Neutrophils Relative % 04/18/2023 68.3    Lymphocytes Relative 04/18/2023 21.4    Monocytes Relative 04/18/2023 9.7    Eosinophils Relative 04/18/2023 0.2    Basophils Relative 04/18/2023 0.4    Neutro Abs 04/18/2023 4.6    Lymphs Abs 04/18/2023 1.4    Monocytes Absolute 04/18/2023 0.6    Eosinophils Absolute 04/18/2023 0.0    Basophils Absolute 04/18/2023 0.0   No image results found. CT ABDOMEN PELVIS W CONTRAST Result Date: 12/09/2023 CLINICAL DATA:  Acute, severe pancreatitis. Right flank pain, bloated feeling. EXAM: CT ANGIOGRAPHY CHEST CT ABDOMEN AND PELVIS WITH CONTRAST TECHNIQUE: Multidetector CT imaging of the chest was performed using the standard protocol during bolus administration of intravenous contrast. Multiplanar CT image reconstructions and MIPs were obtained to evaluate the vascular anatomy. Multidetector CT imaging of the abdomen and pelvis was performed using the standard protocol during bolus administration of intravenous contrast. RADIATION DOSE REDUCTION: This exam was performed  according to the departmental dose-optimization program which includes automated exposure control, adjustment of the mA and/or kV according to patient size and/or use of iterative reconstruction technique. CONTRAST:  OMNIPAQUE  IOHEXOL  350 MG/ML SOLN COMPARISON:  None Available. FINDINGS: CTA CHEST FINDINGS Cardiovascular: Satisfactory opacification of the pulmonary arteries to the segmental level. No evidence of pulmonary embolism. Normal heart size. No pericardial effusion. Enlarged pulmonary arteries, especially the main in left pulmonary arteries, main pulmonary artery measuring up to nearly 5 cm in diameter. Mediastinum/Nodes: No evidence of mass or inflammation. No esophageal thickening Lungs/Pleura: Streaky density in the right lower lung. Generalized airway thickening with emphysema. No edema, effusion, or pneumothorax. Musculoskeletal: No acute finding Review of the MIP images confirms the above findings. CT ABDOMEN and PELVIS FINDINGS Hepatobiliary: Hepatic steatosis.No evidence of biliary obstruction or stone. Pancreas: Unremarkable. Spleen: Unremarkable. Adrenals/Urinary Tract: Negative adrenals. No hydronephrosis or stone. Unremarkable bladder. Stomach/Bowel:  No obstruction. No appendicitis. Vascular/Lymphatic: No acute vascular abnormality. Atheromatous calcification of the aorta and iliacs. No mass or adenopathy. Reproductive:No pathologic findings. Other: No ascites or pneumoperitoneum. Musculoskeletal: No acute abnormalities. Generalized lumbar spine degeneration. Review of the MIP images confirms the above findings. IMPRESSION: Chest CTA: 1. Atelectasis the right lung base, cannot exclude superimposed infection. 2. Dilated main and left pulmonary arteries with unusual appearance of the pulmonary artery outflow tract, question pulmonic valve stenosis, correlate with echocardiography. 3. Emphysema and airway thickening. Abdominal CT: No acute finding. Atherosclerosis and hepatic steatosis.  Electronically Signed   By: Ronnette Coke M.D.   On: 12/09/2023 06:22   CT Angio Chest PE W/Cm &/Or Wo Cm Result Date: 12/09/2023 CLINICAL DATA:  Acute, severe pancreatitis. Right flank pain, bloated feeling. EXAM: CT ANGIOGRAPHY CHEST CT ABDOMEN AND PELVIS WITH CONTRAST TECHNIQUE: Multidetector CT imaging of the chest was performed using the standard protocol during bolus administration of intravenous contrast. Multiplanar CT image reconstructions and MIPs were obtained to evaluate the vascular anatomy. Multidetector CT imaging of the abdomen and pelvis was performed using the standard protocol during bolus administration of intravenous contrast. RADIATION DOSE REDUCTION: This exam  was performed according to the departmental dose-optimization program which includes automated exposure control, adjustment of the mA and/or kV according to patient size and/or use of iterative reconstruction technique. CONTRAST:  OMNIPAQUE  IOHEXOL  350 MG/ML SOLN COMPARISON:  None Available. FINDINGS: CTA CHEST FINDINGS Cardiovascular: Satisfactory opacification of the pulmonary arteries to the segmental level. No evidence of pulmonary embolism. Normal heart size. No pericardial effusion. Enlarged pulmonary arteries, especially the main in left pulmonary arteries, main pulmonary artery measuring up to nearly 5 cm in diameter. Mediastinum/Nodes: No evidence of mass or inflammation. No esophageal thickening Lungs/Pleura: Streaky density in the right lower lung. Generalized airway thickening with emphysema. No edema, effusion, or pneumothorax. Musculoskeletal: No acute finding Review of the MIP images confirms the above findings. CT ABDOMEN and PELVIS FINDINGS Hepatobiliary: Hepatic steatosis.No evidence of biliary obstruction or stone. Pancreas: Unremarkable. Spleen: Unremarkable. Adrenals/Urinary Tract: Negative adrenals. No hydronephrosis or stone. Unremarkable bladder. Stomach/Bowel:  No obstruction. No appendicitis.  Vascular/Lymphatic: No acute vascular abnormality. Atheromatous calcification of the aorta and iliacs. No mass or adenopathy. Reproductive:No pathologic findings. Other: No ascites or pneumoperitoneum. Musculoskeletal: No acute abnormalities. Generalized lumbar spine degeneration. Review of the MIP images confirms the above findings. IMPRESSION: Chest CTA: 1. Atelectasis the right lung base, cannot exclude superimposed infection. 2. Dilated main and left pulmonary arteries with unusual appearance of the pulmonary artery outflow tract, question pulmonic valve stenosis, correlate with echocardiography. 3. Emphysema and airway thickening. Abdominal CT: No acute finding. Atherosclerosis and hepatic steatosis. Electronically Signed   By: Ronnette Coke M.D.   On: 12/09/2023 06:22   DG Chest Port 1 View Result Date: 12/09/2023 CLINICAL DATA:  Shortness of breath and right-sided chest pain EXAM: PORTABLE CHEST 1 VIEW COMPARISON:  03/03/2018 FINDINGS: Cardiac shadow is within normal limits. Prominent pulmonary arteries are seen bilaterally. Right basilar atelectasis is noted. No focal confluent infiltrate is seen. IMPRESSION: Mild right basilar atelectasis. Electronically Signed   By: Violeta Grey M.D.   On: 12/09/2023 03:59   US  Abdomen Limited RUQ (LIVER/GB) Result Date: 12/08/2023 CLINICAL DATA:  Right upper quadrant pain x3 days. EXAM: ULTRASOUND ABDOMEN LIMITED RIGHT UPPER QUADRANT COMPARISON:  July 11, 2019 FINDINGS: Gallbladder: No gallstones or wall thickening visualized (1.4 mm). No sonographic Murphy sign noted by sonographer. Common bile duct: Diameter: 2.5 mm Liver: No focal lesion identified. There is diffusely increased echogenicity of the liver parenchyma. Portal vein is patent on color Doppler imaging with normal direction of blood flow towards the liver. Other: None. IMPRESSION: Hepatic steatosis. Electronically Signed   By: Virgle Grime M.D.   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 Patient experiences shortness of breath without typical pneumonia symptoms such as cough or fever. 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 Differential diagnosis includes valvular heart disease, pulmonary hypertension, and congenital pulmonary arterial anomalies. Plan: Cardiology referral placed today for comprehensive evaluation Echocardiogram to further assess pulmonary artery abnormality and potential valve issues Follow-up chest x-ray ordered to monitor resolution of pneumonia and better visualize pulmonary architecture Chronic pain of left knee Assessment: 9-10 month history of left knee pain, worsening  with prolonged standing. Occupational demands likely contributory as patient is required to stand for extended periods. Plan: Prescribed meloxicam  7.5 mg daily for pain management Discussed activity modification and ergonomic strategies Offered physical therapy and occupational therapy referrals Weight-bearing exercise recommendations provided Cigarette nicotine  dependence with other nicotine -induced disorder Assessment: Ongoing tobacco use with significant motivation to quit. Patient attempted to quit previously using various methods. Recognizes health risks of continued smoking, particularly given pulmonary findings. Plan: Prescribed varenicline  (Chantix  starting pack followed by continuation pack) Detailed instructions provided on medication usage and expectations Discussed potential side effects of varenicline  Emphasized  benefits of smoking cessation on pulmonary and cardiovascular health Recommended continued non-pharmacologic support methods (exercise, mindfulness) Flank pain Assessment: Right flank pain, improved approximately 70% with antibiotic therapy. Pain pattern more consistent with nephrolithiasis than pneumonia. ER evaluation revealed trace leukocytes in urine but culture negative. No dysuria reported. Differential includes kidney stones, musculoskeletal pain, and partially treated pyelonephritis. Plan: Urinalysis with microscopy ordered to evaluate for microscopic kidney stones Continue monitoring symptom progression Encouraged adequate hydration Pneumonia of right lower lobe due to infectious organism Assessment: Pneumonia diagnosed at recent ER visit with CT evidence of right lower lung infiltrates. Patient showing improvement with current antibiotic therapy. WBC was elevated (13.4) at time of diagnosis. No typical symptoms of cough or fever, which is atypical. 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 Plan: Complete current antibiotic course (amoxicillin -clavulanate) Chest x-ray in 4 weeks to confirm resolution Discussed warning signs requiring prompt medical attention Heart murmur  Alcohol use Assessment: Reports consuming approximately 35 standard drinks per week, just beer. No acute withdrawal symptoms. Patient acknowledges that alcohol consumption may trigger increased smoking. Plan: Advised reduction in alcohol consumption to aid smoking cessation efforts Discussed relationship between alcohol use and smoking triggers Provided education on recommended limits and health implications Will reassess at follow-up visit Occupational exposure in workplace Assessment: Works in [employer] and biohazard environments, which may contribute to respiratory issues and infection risk. Plan: Recommended wearing appropriate PPE including face mask or shield  mask to reduce exposure Educated on proper protective measures in high-risk environments Advised on hygiene practices to minimize pathogen transfer Follow-up Plan Return to clinic after cardiology evaluation Bring results of follow-up chest x-ray and urinalysis Telephone check-in regarding smoking cessation progress in 2 weeks Continue monitoring for symptom changes Hepatic steatosis Updated problem overview for this problem to improve longitudinal management incidental finding on CT. COPD with chronic bronchitis and emphysema (HCC)  Total face-to-face time spent with patient: 35 minutes, which included detailed history taking, examination, counseling on diagnosis and management options, and coordination of care.      Orders Placed During this Encounter:   Ambulatory referral to Cardiology       Comments: CT chest for shortness of breath showed  Dilated main and left pulmonary arteries with unusual appearance of the pulmonary artery outflow tract, question pulmonic valve stenosis, correlate with echocardiography.  20 years ago had empyema and surgery for av fistula    Varenicline  Tartrate, Starter, (CHANTIX  STARTING MONTH PAK) 0.5 MG X 11 & 1 MG X 42 TBPK         varenicline  (CHANTIX  CONTINUING MONTH PAK) 1 MG tablet  2 times daily         DG Chest 2 View     pneumonia follow up 4wk     Urinalysis w microscopic + reflex cultur         meloxicam  (MOBIC ) 7.5 MG tablet  Daily  Additional Info: This encounter employed state-of-the-art, real-time, collaborative documentation. The patient actively reviewed and assisted in updating their electronic medical record on a shared screen, ensuring transparency and facilitating joint problem-solving for the problem list, overview, and plan. This approach promotes accurate, informed care. The treatment plan was discussed and reviewed in detail, including medication safety, potential side effects, and all patient  questions. We confirmed understanding and comfort with the plan. Follow-up instructions were established, including contacting the office for any concerns, returning if symptoms worsen, persist, or new symptoms develop, and precautions for potential emergency department visits.   This document was synthesized by artificial intelligence ([employer]bridge) using HIPAA-compliant recording of the clinical interaction;   We discussed the use of AI scribe software for clinical note transcription with the patient, who gave verbal consent to proceed.   Time-Based Attestation for Billing I have personally spent 35 minutes involved in face-to-face and non-face-to-face activities for this patient on the date of service. Professional time spent includes the following activities:  Preparing to see the patient by reviewing emergency room records, imaging reports, and laboratory results prior to and during the encounter Obtaining and documenting a comprehensive interval history, particularly regarding recent emergency room visit and pulmonary symptoms Performing a medically appropriate examination focused on respiratory and musculoskeletal systems Evaluating, synthesizing, and documenting available clinical information in the EMR, including CT findings indicating pulmonary artery abnormalities Ordering appropriate diagnostic studies including follow-up chest imaging and urinalysis Coordinating care through cardiology referral for evaluation of pulmonary artery abnormality Prescribing appropriate medications including meloxicam  for knee pain and varenicline  for smoking cessation Counseling the patient on pneumonia management, smoking cessation strategies, occupational safety, and knee pain management Developing and communicating a comprehensive treatment plan addressing multiple active medical problems  This extended time was medically necessary due to:  Complexity of the patient's clinical presentation with atypical  pneumonia and newly identified pulmonary artery abnormality requiring specialty coordination Multiple comorbidities including COPD, occupational exposures, nicotine  dependence, and alcohol use requiring comprehensive management Detailed patient education regarding smoking cessation medication (varenicline ) requiring extensive counseling on usage and expectations Need to review and integrate recent emergency room findings with current presentation  The level of service provided today is supported by a total time of 35 minutes spent in evaluation and management activities, qualifying for CPT code 16109 (30-39 minutes).

## 2023-12-20 DIAGNOSIS — K760 Fatty (change of) liver, not elsewhere classified: Secondary | ICD-10-CM | POA: Insufficient documentation

## 2023-12-20 NOTE — Assessment & Plan Note (Signed)
 Assessment: 9-10 month history of left knee pain, worsening with prolonged standing. Occupational demands likely contributory as patient is required to stand for extended periods. Plan: Prescribed meloxicam 7.5 mg daily for pain management Discussed activity modification and ergonomic strategies Offered physical therapy and occupational therapy referrals Weight-bearing exercise recommendations provided

## 2023-12-20 NOTE — Assessment & Plan Note (Signed)
 Updated problem overview for this problem to improve longitudinal management incidental finding on CT.

## 2023-12-20 NOTE — Assessment & Plan Note (Signed)
 Assessment: Reports consuming approximately 35 standard drinks per week, just beer. No acute withdrawal symptoms. Patient acknowledges that alcohol consumption may trigger increased smoking. Plan: Advised reduction in alcohol consumption to aid smoking cessation efforts Discussed relationship between alcohol use and smoking triggers Provided education on recommended limits and health implications Will reassess at follow-up visit

## 2023-12-20 NOTE — Assessment & Plan Note (Signed)
 Assessment: Ongoing tobacco use with significant motivation to quit. Patient attempted to quit previously using various methods. Recognizes health risks of continued smoking, particularly given pulmonary findings. Plan: Prescribed varenicline (Chantix starting pack followed by continuation pack) Detailed instructions provided on medication usage and expectations Discussed potential side effects of varenicline Emphasized benefits of smoking cessation on pulmonary and cardiovascular health Recommended continued non-pharmacologic support methods (exercise, mindfulness)

## 2023-12-20 NOTE — Assessment & Plan Note (Signed)
 Assessment: Right flank pain, improved approximately 70% with antibiotic therapy. Pain pattern more consistent with nephrolithiasis than pneumonia. ER evaluation revealed trace leukocytes in urine but culture negative. No dysuria reported. Differential includes kidney stones, musculoskeletal pain, and partially treated pyelonephritis. Plan: Urinalysis with microscopy ordered to evaluate for microscopic kidney stones Continue monitoring symptom progression Encouraged adequate hydration

## 2023-12-20 NOTE — Assessment & Plan Note (Signed)
 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 Plan: Recommended wearing appropriate PPE including face mask or shield mask to reduce exposure Educated on proper protective measures in high-risk environments Advised on hygiene practices to minimize pathogen transfer Follow-up Plan Return to clinic after cardiology evaluation Bring results of follow-up chest x-ray and urinalysis Telephone check-in regarding smoking cessation progress in 2 weeks Continue monitoring for symptom changes

## 2023-12-20 NOTE — Patient Instructions (Signed)
 YOUR VISIT SUMMARY - December 19, 2023   Thank you for your visit today. Here's a summary of what we discussed and your care plan: Condition Overview  Pulmonary Artery Finding Your imaging shows some abnormalities in your pulmonary artery (the blood vessel carrying blood from your heart to your lungs) that need further evaluation.  Pneumonia Your right lung infection appears to be improving with antibiotics.  Flank Pain The pain in your side has improved 70% with antibiotics, but we need to check your urine for possible kidney stones.  Left Knee Pain Your ongoing knee pain is likely related to your work requirements and prolonged standing.   YOUR ACTION PLAN   Medications  Complete your antibiotics as prescribed  Meloxicam 7.5 mg: Take 1 tablet daily for knee pain  Varenicline (Chantix): Start with the starter pack, then continue with the monthly pack to help quit smoking   Tests  Chest X-ray: Schedule in 4 weeks to check pneumonia resolution  Urinalysis: Please provide a urine sample for microscopic analysis  Echocardiogram: Will be scheduled through cardiology   Referrals  Cardiology: Referral placed today to evaluate your pulmonary artery finding  Our office will call you with appointment details   Lifestyle  Smoking cessation: Follow Chantix instructions carefully  Alcohol reduction: Reducing alcohol will help your smoking cessation efforts  Work safety: Use proper protective equipment in biohazard environments  Knee pain: Consider ergonomic adjustments at work; take breaks from standing    SEEK IMMEDIATE MEDICAL ATTENTION if you experience: Severe shortness of breath or difficulty breathing Chest pain or pressure Fever greater than 101F Severe flank pain or blood in urine     CHANTIX (VARENICLINE) INFORMATION:  Start with the starter pack: Take 0.5 mg once daily for days 1-3, then 0.5 mg twice daily for days 4-7, then 1 mg twice daily  Continue with monthly pack: Take 1 mg twice  daily  Take with a full glass of water after eating  Set a quit date within first week of treatment  Contact us  if you experience mood changes, aggressive behavior, or suicidal thoughts    IMPORTANT REMINDERS:  Complete full course of antibiotics even if you feel better  Stay well-hydrated to help with kidney function and overall recovery  At work, wear appropriate protective equipment to minimize exposure  Call our office if pain worsens or new symptoms develop  Your follow-up: After your cardiology appointment and completion of your chest x-ray Please call our office at [phone] if you have any questions or concerns.

## 2023-12-20 NOTE — Assessment & Plan Note (Signed)
 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 Patient experiences shortness of breath without typical pneumonia symptoms such as cough or fever. 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 Differential diagnosis includes valvular heart disease, pulmonary hypertension, and congenital pulmonary arterial anomalies. Plan: Cardiology referral placed today for comprehensive evaluation Echocardiogram to further assess pulmonary artery abnormality and potential valve issues Follow-up chest x-ray ordered to monitor resolution of pneumonia and better visualize pulmonary architecture

## 2023-12-20 NOTE — Assessment & Plan Note (Signed)
 Assessment: Pneumonia diagnosed at recent ER visit with CT evidence of right lower lung infiltrates. Patient showing improvement with current antibiotic therapy. WBC was elevated (13.4) at time of diagnosis. No typical symptoms of cough or fever, which is atypical. 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 Plan: Complete current antibiotic course (amoxicillin -clavulanate) Chest x-ray in 4 weeks to confirm resolution Discussed warning signs requiring prompt medical attention

## 2024-01-09 ENCOUNTER — Telehealth: Payer: Self-pay

## 2024-01-09 NOTE — Telephone Encounter (Signed)
 Called pt back about referral sent over message to Referral team. In chart states auth  Copied from CRM [id number]. Topic: General - Other >> Jan 09, 2024 11:09 AM Annelle Kiel wrote: Reason for CRM: patient is needing a call back regarding his referral for a pulmonologist he thought he has a appt scheduled for one he would like a call back today regarding thos

## 2024-01-09 NOTE — Telephone Encounter (Signed)
 Spoke with pt reguarding referral check chart status is pending review to Authorized sent over message to referral team.

## 2024-01-24 ENCOUNTER — Other Ambulatory Visit: Payer: Self-pay | Admitting: Internal Medicine

## 2024-01-24 DIAGNOSIS — G8929 Other chronic pain: Secondary | ICD-10-CM

## 2024-04-23 ENCOUNTER — Encounter: Payer: 59 | Admitting: Internal Medicine

## 2024-04-23 ENCOUNTER — Encounter (HOSPITAL_BASED_OUTPATIENT_CLINIC_OR_DEPARTMENT_OTHER): Payer: Self-pay | Admitting: Cardiology

## 2024-04-23 ENCOUNTER — Ambulatory Visit (HOSPITAL_BASED_OUTPATIENT_CLINIC_OR_DEPARTMENT_OTHER): Admitting: Cardiology

## 2024-04-23 VITALS — BP 142/88 | HR 88 | Ht 73.0 in | Wt 175.8 lb

## 2024-04-23 DIAGNOSIS — I700 Atherosclerosis of aorta: Secondary | ICD-10-CM

## 2024-04-23 DIAGNOSIS — Q2579 Other congenital malformations of pulmonary artery: Secondary | ICD-10-CM | POA: Diagnosis not present

## 2024-04-23 DIAGNOSIS — F17200 Nicotine dependence, unspecified, uncomplicated: Secondary | ICD-10-CM | POA: Diagnosis not present

## 2024-04-23 DIAGNOSIS — K760 Fatty (change of) liver, not elsewhere classified: Secondary | ICD-10-CM

## 2024-04-23 DIAGNOSIS — Z7189 Other specified counseling: Secondary | ICD-10-CM

## 2024-04-23 DIAGNOSIS — Z712 Person consulting for explanation of examination or test findings: Secondary | ICD-10-CM | POA: Diagnosis not present

## 2024-04-23 DIAGNOSIS — Z716 Tobacco abuse counseling: Secondary | ICD-10-CM

## 2024-04-23 DIAGNOSIS — R011 Cardiac murmur, unspecified: Secondary | ICD-10-CM

## 2024-04-23 DIAGNOSIS — I370 Nonrheumatic pulmonary valve stenosis: Secondary | ICD-10-CM

## 2024-04-23 DIAGNOSIS — R748 Abnormal levels of other serum enzymes: Secondary | ICD-10-CM

## 2024-04-23 NOTE — Progress Notes (Signed)
 Cardiology Office Note:  .   Date:  04/23/2024  ID:  Marsha DELENA Quan, DOB 1971/07/24, MRN [id number] PCP: Jesus Bernardino MATSU, MD  [facility] Providers Cardiologist:  Shelda Bruckner, MD {  History of Present Illness: .   DERIUS GHOSH is a 53 y.o. male with PMH OSA on CPAP, lung fistula/empyema s/p resection, abnormal PA on imaging who is seen as a new patient consultation at the request of Dr. Jesus for abnormal PA on CT.  Today: Referral from 12/19/23 reviewed. CT showed dilated main PA and L PA, question pulmonic valve stenosis. Referred for further evaluation. On chart review, had echo in May 04, 2018 noting increased velocities across pulmonic valve consistent with mild stenosis. Mild pulmonary hypertension noted.  Patient had lung surgery >20 years ago for fistula and empyema. Noted history of COPD. He is a current smoker as well, also with significant alcohol use.  He is not on antihypertensives but initial blood pressure reading here elevated. He notes that he is under a lot of stress. Was on medication (water pills) for his blood pressure but no longer taking. Trying to manage with lifestyle and supplements.  No chest pain. Notes that if he pushes himself hard, he can get a little winded but doesn't have to stop. 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 Felt somewhat winded/fatigued but didn't have to stop. Able to play basketball with his 65 year old son at a fast pace without limitations.  Uses CPAP when he sleeps.  FH: sister has a heart murmur. Brother passed away in 05/04/2018 from aortic dissection (age 66). His 54 month old daughter is currently in ICU for heart issues (unclear).   ROS: Denies chest pain, shortness of breath at rest or with normal exertion. No PND, orthopnea, LE edema or unexpected weight gain. No syncope or palpitations. ROS otherwise negative except as noted.   Studies Reviewed:  SABRA    EKG:  EKG Interpretation Date/Time:  Friday April 23 2024 09:59:50 EDT Ventricular Rate:  88 PR Interval:  164 QRS Duration:  102 QT Interval:  356 QTC Calculation: 430 R Axis:   75  Text Interpretation: Normal sinus rhythm Incomplete right bundle branch block When compared with ECG of 14-Aug-2017 18:21, No significant change was found Confirmed by Bruckner Shelda [phone]) on 04/23/2024 10:07:45 AM    Physical Exam:   VS:  BP (!) 142/88   Pulse 88   Ht 6' 1 (1.854 m)   Wt 175 lb 12.8 oz (79.7 kg)   SpO2 93%   BMI 23.19 kg/m    Wt Readings from Last 3 Encounters:  04/23/24 175 lb 12.8 oz (79.7 kg)  12/19/23 175 lb 12.8 oz (79.7 kg)  12/08/23 172 lb (78 kg)    GEN: Well nourished, well developed in no acute distress HEENT: Normal, moist mucous membranes NECK: No JVD CARDIAC: regular rhythm, normal S1 and S2, no rubs or gallops. 2-3/6 systolic ejection murmur. VASCULAR: Radial and DP pulses 2+ bilaterally. No carotid bruits RESPIRATORY:  Clear to auscultation without rales, wheezing or rhonchi  ABDOMEN: Soft, non-tender, non-distended MUSCULOSKELETAL:  Ambulates independently SKIN: Warm and dry, no edema NEUROLOGIC:  Alert and oriented x 3. No focal neuro deficits noted. PSYCHIATRIC:  Normal affect    ASSESSMENT AND PLAN: .    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 If needed, cardiac MRI and/or right heart cath also discussed  Aortic atherosclerosis Hepatic steatosis History of mildly abnormal LFTs -seen on CT -reviewed his lipids, Tchol 205, TG 87, HDL 110, LDL 77 -he is a smoker, discussed how this affects plaque and vessels -he drinks 6-8 beers/day. Discussed CV risk with this. Also discussed how this affects liver function -discussed statins today. He is amenable if it would help reduce risk, but wants to try to quit smoking and cut back on  drinking first. Will recheck labs in 6 mos -reviewed his liver function. AST:ALT ratio slightly over 1, AST has been over 100 before but most recent 42. Would need to be cautious if statin started, would use rosuvastatin 5 mg dose to start.  Elevated blood pressure reading: improved but not normalized on recheck. Going to work on lifestyle first, but if still elevated at follow up with Dr. Jesus would consider starting medication.  Tobacco cessation counseling: The patient was counseled on tobacco cessation today for 5 minutes.  Counseling included reviewing the risks of smoking tobacco products, how it impacts the patient's current medical diagnoses and different strategies for quitting.  Pharmacotherapy to aid in tobacco cessation was not prescribed today. He has discussed chantix  with Dr. Jesus  CV risk counseling and prevention -recommend heart healthy/Mediterranean diet, with whole grains, fruits, vegetable, fish, lean meats, nuts, and olive oil. Limit salt. -recommend moderate walking, 3-5 times/week for 30-50 minutes each session. Aim for at least 150 minutes/week. Goal should be pace of 3 miles/hours, or walking 1.5 miles in 30 minutes -recommend avoidance of tobacco products. Avoid excess alcohol.  Dispo: 1 year or sooner as needed  Signed, Shelda Bruckner, MD   Shelda Bruckner, MD, PhD, [facility]  [facility]  [place]  [facility] & [facility] at [facility] at [facility] [address] [place], [place] [address] [phone]

## 2024-04-23 NOTE — Patient Instructions (Addendum)
 Medication Instructions:  Your physician recommends that you continue on your current medications as directed. Please refer to the Current Medication list given to you today.  *If you need a refill on your cardiac medications before your next appointment, please call your pharmacy*  Lab Work: FASTING LABS IN 6 MONTHS   If you have labs (blood work) drawn today and your tests are completely normal, you will receive your results only by: MyChart Message (if you have MyChart) OR A paper copy in the mail If you have any lab test that is abnormal or we need to change your treatment, we will call you to review the results.  Testing/Procedures: Your physician has requested that you have an echocardiogram. Echocardiography is a painless test that uses sound waves to create images of your heart. It provides your doctor with information about the size and shape of your heart and how well your heart's chambers and valves are working. This procedure takes approximately one hour. There are no restrictions for this procedure. Please do NOT wear cologne, perfume, aftershave, or lotions (deodorant is allowed). Please arrive 15 minutes prior to your appointment time.  Please note: We ask at that you not bring children with you during ultrasound (echo/ vascular) testing. Due to room size and safety concerns, children are not allowed in the ultrasound rooms during exams. Our front office staff cannot provide observation of children in our lobby area while testing is being conducted. An adult accompanying a patient to their appointment will only be allowed in the ultrasound room at the discretion of the ultrasound technician under special circumstances. We apologize for any inconvenience.  Follow-Up: At [facility], you and your health needs are our priority.  As part of our continuing mission to provide you with exceptional heart care, our providers are all part of one team.  This team includes your  primary Cardiologist (physician) and Advanced Practice Providers or APPs (Physician Assistants and Nurse Practitioners) who all work together to provide you with the care you need, when you need it.  Your next appointment:   12 month(s)  Provider:   Shelda Bruckner, MD, Rosaline Bane, NP, or Reche Finder, NP   We recommend signing up for the patient portal called MyChart.  Sign up information is provided on this After Visit Summary.  MyChart is used to connect with patients for Virtual Visits (Telemedicine).  Patients are able to view lab/test results, encounter notes, upcoming appointments, etc.  Non-urgent messages can be sent to your provider as well.   To learn more about what you can do with MyChart, go to [email or web address].   Other Instructions  Work on quitting smoking, cutting back alcohol as much as you can. Focus on healthy lifestyle. We will recheck cholesterol and liver function in about 6 mos. If not where we want to be, we can start low dose of a cholesterol medication at that time.

## 2024-05-14 ENCOUNTER — Other Ambulatory Visit: Payer: Self-pay

## 2024-05-14 ENCOUNTER — Telehealth: Payer: Self-pay

## 2024-05-14 DIAGNOSIS — Z1211 Encounter for screening for malignant neoplasm of colon: Secondary | ICD-10-CM

## 2024-05-14 NOTE — Telephone Encounter (Signed)
 Copied from CRM [id number]. Topic: Clinical - Request for Lab/Test Order >> May 14, 2024  2:09 PM Alex Maldonado wrote: Reason for CRM: Patient is requesting for a new cologuard test kit as the one he has , has expired due to not completing it.

## 2024-05-21 ENCOUNTER — Ambulatory Visit (INDEPENDENT_AMBULATORY_CARE_PROVIDER_SITE_OTHER)

## 2024-05-21 DIAGNOSIS — Q2579 Other congenital malformations of pulmonary artery: Secondary | ICD-10-CM | POA: Diagnosis not present

## 2024-05-21 DIAGNOSIS — I370 Nonrheumatic pulmonary valve stenosis: Secondary | ICD-10-CM | POA: Diagnosis not present

## 2024-05-21 DIAGNOSIS — R011 Cardiac murmur, unspecified: Secondary | ICD-10-CM | POA: Diagnosis not present

## 2024-05-21 LAB — ECHOCARDIOGRAM COMPLETE
AR max vel: 1.97 cm2
AV Area VTI: 2.02 cm2
AV Area mean vel: 1.9 cm2
AV Mean grad: 3 mmHg
AV Peak grad: 5.8 mmHg
Ao pk vel: 1.2 m/s
Area-P 1/2: 4.1 cm2
S' Lateral: 3.08 cm

## 2024-05-28 ENCOUNTER — Encounter: Admitting: Internal Medicine

## 2024-05-28 ENCOUNTER — Telehealth: Payer: Self-pay | Admitting: Cardiology

## 2024-05-28 NOTE — Telephone Encounter (Signed)
Patient calling in regards to his results. Please advise.

## 2024-05-28 NOTE — Telephone Encounter (Signed)
 Advised patient once Dr Lonni reviews Echo will reach out

## 2024-06-01 ENCOUNTER — Encounter: Payer: Self-pay | Admitting: Internal Medicine

## 2024-06-02 ENCOUNTER — Ambulatory Visit (HOSPITAL_BASED_OUTPATIENT_CLINIC_OR_DEPARTMENT_OTHER): Payer: Self-pay | Admitting: Cardiology

## 2024-06-15 NOTE — Telephone Encounter (Signed)
  Ultrasound of the heart shows normal function. The pulmonic valve is mild to moderately tight, nothing we need to fix now but we will continue to monitor it.  Written by Shelda Bruckner, MD on 06/02/2024  9:09 AM EDT  Advised patient, verbalized understanding

## 2024-07-02 LAB — COLOGUARD: COLOGUARD: NEGATIVE

## 2024-07-03 ENCOUNTER — Ambulatory Visit: Payer: Self-pay | Admitting: Internal Medicine

## 2024-07-04 NOTE — Progress Notes (Signed)
 Key Points for Patient Follow-Up Call:  Test Results Summary:    Test: Cologuard (stool DNA test for colorectal cancer screening)    Result: Negative    Significance: 99.94% certainty of no colorectal cancer present  Clinical Interpretation:    Negative result is reassuring but not a 100% guarantee    Consistent with current health status and absence of reported symptoms  Patient Instructions:    No immediate actions required based on this result    Continue current lifestyle habits, emphasizing healthy diet and regular exercise    Report any new digestive symptoms or changes in bowel habits promptly  Follow-up Plan:    Next Cologuard test due in 3 years (adjust if high-risk family history)    Routine check-ups as previously scheduled  Addressing Patient Concerns: If patient asks about other screening options: "While Cologuard is highly effective, Dr. Jon Billings can discuss alternative screening methods like colonoscopy at your next visit if you're interested." If patient inquires about earlier rescreening: "The 3-year interval is based on current guidelines. However, if you have concerns or new symptoms, please let us know immediately."  Next Steps: Confirm patient understands the result and follow-up plan Offer to schedule next routine appointment if needed Remind patient to reach out with any new concerns before next scheduled screening  Note: Patient education on maintaining colorectal health through diet, exercise, and prompt reporting of symptoms is crucial. Your role in reinforcing these points is greatly appreciated.

## 2024-07-05 NOTE — Progress Notes (Signed)
 Spoke with pt about colonguard results understood well.
